# Patient Record
Sex: Female | Born: 1935 | Race: White | Hispanic: No | Smoking: Never smoker
Health system: Southern US, Community
[De-identification: ages and names within clinical notes are randomized; demographics above are authoritative.]

## PROBLEM LIST (undated history)

## (undated) DIAGNOSIS — K219 Gastro-esophageal reflux disease without esophagitis: Secondary | ICD-10-CM

## (undated) DIAGNOSIS — E785 Hyperlipidemia, unspecified: Secondary | ICD-10-CM

## (undated) DIAGNOSIS — I10 Essential (primary) hypertension: Secondary | ICD-10-CM

## (undated) DIAGNOSIS — N3281 Overactive bladder: Secondary | ICD-10-CM

## (undated) DIAGNOSIS — M1990 Unspecified osteoarthritis, unspecified site: Secondary | ICD-10-CM

## (undated) DIAGNOSIS — N1831 Chronic kidney disease, stage 3a: Secondary | ICD-10-CM

## (undated) DIAGNOSIS — J309 Allergic rhinitis, unspecified: Secondary | ICD-10-CM

## (undated) DIAGNOSIS — M810 Age-related osteoporosis without current pathological fracture: Secondary | ICD-10-CM

## (undated) HISTORY — DX: Hyperlipidemia, unspecified: E78.5

## (undated) HISTORY — DX: Age-related osteoporosis without current pathological fracture: M81.0

## (undated) HISTORY — PX: TUBAL LIGATION: SHX77

## (undated) HISTORY — PX: TONSILLECTOMY: SUR1361

## (undated) HISTORY — PX: APPENDECTOMY: SHX54

## (undated) HISTORY — DX: Unspecified osteoarthritis, unspecified site: M19.90

## (undated) HISTORY — DX: Essential (primary) hypertension: I10

## (undated) HISTORY — DX: Allergic rhinitis, unspecified: J30.9

## (undated) HISTORY — PX: CARPAL TUNNEL RELEASE: SHX101

## (undated) HISTORY — DX: Gastro-esophageal reflux disease without esophagitis: K21.9

## (undated) HISTORY — DX: Overactive bladder: N32.81

---

## 2004-12-25 ENCOUNTER — Ambulatory Visit: Payer: Self-pay | Admitting: Gastroenterology

## 2007-02-10 ENCOUNTER — Ambulatory Visit: Payer: Self-pay | Admitting: Orthopaedic Surgery

## 2010-04-14 ENCOUNTER — Ambulatory Visit: Payer: Self-pay | Admitting: Family Medicine

## 2011-05-25 ENCOUNTER — Ambulatory Visit: Payer: Self-pay | Admitting: Family Medicine

## 2012-05-31 ENCOUNTER — Ambulatory Visit: Payer: Self-pay | Admitting: Family Medicine

## 2013-06-06 ENCOUNTER — Ambulatory Visit: Payer: Self-pay | Admitting: Family Medicine

## 2014-06-12 ENCOUNTER — Ambulatory Visit: Payer: Self-pay | Admitting: Family Medicine

## 2014-08-21 DIAGNOSIS — N3281 Overactive bladder: Secondary | ICD-10-CM | POA: Insufficient documentation

## 2014-08-21 HISTORY — DX: Overactive bladder: N32.81

## 2015-02-11 ENCOUNTER — Ambulatory Visit (INDEPENDENT_AMBULATORY_CARE_PROVIDER_SITE_OTHER): Payer: Medicare Other | Admitting: Urology

## 2015-02-11 ENCOUNTER — Encounter: Payer: Self-pay | Admitting: Urology

## 2015-02-11 VITALS — BP 96/61 | HR 78 | Ht 63.0 in | Wt 125.0 lb

## 2015-02-11 DIAGNOSIS — N362 Urethral caruncle: Secondary | ICD-10-CM | POA: Insufficient documentation

## 2015-02-11 DIAGNOSIS — N3281 Overactive bladder: Secondary | ICD-10-CM | POA: Insufficient documentation

## 2015-02-11 DIAGNOSIS — J309 Allergic rhinitis, unspecified: Secondary | ICD-10-CM

## 2015-02-11 DIAGNOSIS — R3915 Urgency of urination: Secondary | ICD-10-CM

## 2015-02-11 DIAGNOSIS — M1990 Unspecified osteoarthritis, unspecified site: Secondary | ICD-10-CM

## 2015-02-11 DIAGNOSIS — M810 Age-related osteoporosis without current pathological fracture: Secondary | ICD-10-CM

## 2015-02-11 DIAGNOSIS — K219 Gastro-esophageal reflux disease without esophagitis: Secondary | ICD-10-CM

## 2015-02-11 DIAGNOSIS — I10 Essential (primary) hypertension: Secondary | ICD-10-CM

## 2015-02-11 DIAGNOSIS — E785 Hyperlipidemia, unspecified: Secondary | ICD-10-CM

## 2015-02-11 HISTORY — DX: Essential (primary) hypertension: I10

## 2015-02-11 HISTORY — DX: Unspecified osteoarthritis, unspecified site: M19.90

## 2015-02-11 HISTORY — DX: Age-related osteoporosis without current pathological fracture: M81.0

## 2015-02-11 HISTORY — DX: Hyperlipidemia, unspecified: E78.5

## 2015-02-11 HISTORY — DX: Gastro-esophageal reflux disease without esophagitis: K21.9

## 2015-02-11 HISTORY — DX: Allergic rhinitis, unspecified: J30.9

## 2015-02-11 LAB — URINALYSIS, COMPLETE
BILIRUBIN UA: NEGATIVE
Glucose, UA: NEGATIVE
KETONES UA: NEGATIVE
Nitrite, UA: NEGATIVE
Urobilinogen, Ur: 0.2 mg/dL (ref 0.2–1.0)
pH, UA: 5.5 (ref 5.0–7.5)

## 2015-02-11 LAB — MICROSCOPIC EXAMINATION: Bacteria, UA: NONE SEEN

## 2015-02-11 LAB — BLADDER SCAN AMB NON-IMAGING: Scan Result: 50

## 2015-02-11 MED ORDER — ESTROGENS, CONJUGATED 0.625 MG/GM VA CREA: 1.0000 | TOPICAL_CREAM | Freq: Every day | VAGINAL | Status: DC

## 2015-02-11 NOTE — Progress Notes (Signed)
02/11/2015 11:25 AM   Junius Argyle Feb 23, 1932 [id number]  Referring provider: Sula Rumple, MD [address] MEDICAL [address] [place], [place] [address]  Chief Complaint  Patient presents with  . Over Active Bladder    HPI: Ms Yankey is a 82yo seen in consultation for OAB. She is referred by Dr. Elmer Ramp. She has been on detrol LA and one other anticholinergic but she does recall the name of it. THe urgency and urge incontinence started over 6 months ago. She has nocturia 1x. She wears 3-4 panty liners which are usually soaked.  She wears pads at night which are often soaked when she wakes. She gets occasional bladder infections. She has not tried estrogen cream. No hx of breast cancer.  She has rare SUI. NO hx of spine surgery or diabetes. No issues with edema. PVR today is 50cc.  NO issues with constipation. UA today showed WBCs.    PMH: Past Medical History  Diagnosis Date  . BP (high blood pressure) 02/11/2015  . Allergic rhinitis 02/11/2015  . Acid reflux 02/11/2015  . Arthritis 02/11/2015  . 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 HLD (hyperlipidemia) 02/11/2015    Surgical History: Past Surgical History  Procedure Laterality Date  . Appendectomy    . Tonsillectomy    . Tubal ligation    . Carpal tunnel release      Home Medications:    Medication List       This list is accurate as of: 02/11/15 11:25 AM.  Always use your most recent med list.               alendronate 70 MG tablet  Commonly known as:  FOSAMAX     amLODipine 5 MG tablet  Commonly known as:  NORVASC  Take by mouth.     aspirin EC 81 MG tablet  Take by mouth.     calcium carbonate 1250 (500 CA) MG tablet  Commonly known as:  OS-CAL - dosed in mg of elemental calcium  Take by mouth.     CENTRUM SILVER ADULT 50+ PO  Take by mouth.     Glucosamine-Chondroitin 750-600 MG Chew  Chew by mouth.     lisinopril-hydrochlorothiazide 10-12.5 MG tablet    Commonly known as:  PRINZIDE,ZESTORETIC  Take by mouth.     lovastatin 20 MG tablet  Commonly known as:  MEVACOR     omeprazole 10 MG capsule  Commonly known as:  PRILOSEC  Take by mouth.     STOOL SOFTENER LAXATIVE DC 240 MG capsule  Generic drug:  docusate calcium  Take by mouth.     tolterodine 2 MG 24 hr capsule  Commonly known as:  DETROL LA  Take by mouth.     vitamin E 400 UNIT capsule  Take by mouth.        Allergies:  Allergies  Allergen Reactions  . Celecoxib Other (See Comments)    Shakiness  . Metronidazole Nausea Only  . Typhoid Vaccines Swelling  . Penicillins Rash    Family History: Family History  Problem Relation Age of Onset  . Hematuria Neg Hx   . Prostate cancer Neg Hx   . Kidney cancer Neg Hx   . Bladder Cancer Neg Hx     Social History:  reports that she has never smoked. She does not have any smokeless tobacco history on file. She reports that she does not drink alcohol or use illicit drugs.  ROS: UROLOGY Frequent  Urination?: Yes Hard to postpone urination?: Yes Burning/pain with urination?: Yes Get up at night to urinate?: Yes Leakage of urine?: Yes Urine stream starts and stops?: Yes Trouble starting stream?: Yes Do you have to strain to urinate?: No Blood in urine?: No Urinary tract infection?: No Sexually transmitted disease?: No Injury to kidneys or bladder?: No Painful intercourse?: No Weak stream?: No Currently pregnant?: No Vaginal bleeding?: No Last menstrual period?: No  Gastrointestinal Nausea?: No Vomiting?: No Indigestion/heartburn?: Yes Diarrhea?: No Constipation?: No  Constitutional Fever: No Night sweats?: No Weight loss?: No Fatigue?: Yes  Skin Skin rash/lesions?: No Itching?: No  Eyes Blurred vision?: No Double vision?: No  Ears/Nose/Throat Sore throat?: No Sinus problems?: Yes  Hematologic/Lymphatic Swollen glands?: No Easy bruising?: Yes  Cardiovascular Leg swelling?: No Chest  pain?: No  Respiratory Cough?: No Shortness of breath?: No  Endocrine Excessive thirst?: No  Musculoskeletal Back pain?: Yes Joint pain?: Yes  Neurological Headaches?: Yes Dizziness?: No  Psychologic Depression?: No Anxiety?: No  Physical Exam: BP 96/61 mmHg  Pulse 78  Ht  (1.6 m)  Wt 56.7 kg (125 lb)  BMI 22.15 kg/m2  Constitutional:  Alert and oriented, No acute distress. HEENT: [place] AT, moist mucus membranes.  Trachea midline, no masses. Cardiovascular: No clubbing, cyanosis, or edema. Respiratory: Normal respiratory effort, no increased work of breathing. GI: Abdomen is soft, nontender, nondistended, no abdominal masses GU: No CVA tenderness. Moderate labial edema. Severe introital stenosis. No masses/lesiosn in vagina. Urethral caruncle at 5 oclock. Grade 1 cystocele. Skin: No rashes, bruises or suspicious lesions. Lymph: No cervical or inguinal adenopathy. Neurologic: Grossly intact, no focal deficits, moving all 4 extremities. Psychiatric: Normal mood and affect.  Laboratory Data: No results found for: WBC, HGB, HCT, MCV, PLT  No results found for: CREATININE  No results found for: PSA  No results found for: TESTOSTERONE  No results found for: HGBA1C  Urinalysis No results found for: COLORURINE, APPEARANCEUR, LABSPEC, PHURINE, GLUCOSEU, HGBUR, BILIRUBINUR, KETONESUR, PROTEINUR, UROBILINOGEN, NITRITE, LEUKOCYTESUR  Pertinent Imaging: none  Assessment & Plan:    1. OAB (overactive bladder) -mirabegron  daily - Urinalysis, Complete - Bladder Scan (Post Void Residual) in office  2. Urethral caruncle -premarin daily for 14 days then 3x per week for 3 months   No Follow-up on file.  Malen Gauze, MD  [facility] [address] [place], [place] [address] [phone]

## 2015-02-11 NOTE — Progress Notes (Signed)
Bladder Scan Patient void: 50 ml Performed By: Theotis Burrow

## 2015-03-14 ENCOUNTER — Ambulatory Visit (INDEPENDENT_AMBULATORY_CARE_PROVIDER_SITE_OTHER): Payer: Medicare Other | Admitting: Urology

## 2015-03-14 ENCOUNTER — Encounter: Payer: Self-pay | Admitting: Urology

## 2015-03-14 VITALS — BP 102/62 | HR 83 | Ht 63.0 in | Wt 126.0 lb

## 2015-03-14 DIAGNOSIS — N3281 Overactive bladder: Secondary | ICD-10-CM | POA: Diagnosis not present

## 2015-03-14 LAB — BLADDER SCAN AMB NON-IMAGING: SCAN RESULT: 25

## 2015-03-14 MED ORDER — MIRABEGRON ER 50 MG PO TB24: 50.0000 mg | ORAL_TABLET | Freq: Every day | ORAL | Status: DC

## 2015-03-14 NOTE — Progress Notes (Signed)
Bladder Scan Patient  void: 25 ml Performed By: Theotis BurrowK.russell,CMA

## 2015-03-14 NOTE — Progress Notes (Signed)
03/14/2015 9:27 AM   April ArgyleBeverly Joyce Weaver Aug 11, 1931 [id number]  Referring provider: Sula Rumpleharanjit Virk, MD [address] [place], [place] [id number]  Chief Complaint  Patient presents with  . Over Active Bladder    x 1 mth f/u, burning in legs that she contributes to the premarin. also right hip pain that she is unsure if its related to arthritis or medication.     HPI: Ms April Weaver is a 82yo seen in consultation for OAB. She is referred by Dr. Elmer RampVirk. She has been on detrol LA and one other anticholinergic but she does recall the name of it. THe urgency and urge incontinence started over 6 months ago. She has nocturia 1x. She wears 3-4 panty liners which are usually soaked. She wears pads at night which are often soaked when she wakes. She gets occasional bladder infections. She has not tried estrogen cream. No hx of breast cancer.  She has rare SUI. NO hx of spine surgery or diabetes. No issues with edema.  Interval history: The patient returns today after being ejected for approximate 1 month. She rates her urinary symptoms is better. She says her knowing, but overall she is happy with the improvement. She states that they've improve the point she wouldn't consider any further progress of treatment. She is down to 2 pads per day. Her voiding diary shows mostly urgency rated at between 2 and 3.   PMH: Past Medical History  Diagnosis Date  . BP (high blood pressure) 02/11/2015  . Allergic rhinitis 02/11/2015  . Acid reflux 02/11/2015  . Arthritis 02/11/2015  . 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 HLD (hyperlipidemia) 02/11/2015    Surgical History: Past Surgical History  Procedure Laterality Date  . Appendectomy    . Tonsillectomy    . Tubal ligation    . Carpal tunnel release      Home Medications:    Medication List       This list is accurate as of: 03/14/15  9:27 AM.  Always use your most recent med list.               alendronate 70 MG tablet  Commonly known as:  FOSAMAX     alendronate 70 MG tablet  Commonly known as:  FOSAMAX  Take by mouth.     amLODipine 5 MG tablet  Commonly known as:  NORVASC  Take by mouth.     aspirin EC 81 MG tablet  Take by mouth.     calcium carbonate 1250 (500 CA) MG tablet  Commonly known as:  OS-CAL - dosed in mg of elemental calcium  Take by mouth.     CENTRUM SILVER ADULT 50+ PO  Take by mouth.     conjugated estrogens vaginal cream  Commonly known as:  PREMARIN  Place 1 Applicatorful vaginally daily. Daily for 14 days then 3x per week for 3 months     Glucosamine-Chondroitin 750-600 MG Chew  Chew by mouth.     lisinopril-hydrochlorothiazide 10-12.5 MG tablet  Commonly known as:  PRINZIDE,ZESTORETIC  Take by mouth.     lovastatin 20 MG tablet  Commonly known as:  MEVACOR     MYRBETRIQ 50 MG Tb24 tablet  Generic drug:  mirabegron ER  Take 50 mg by mouth daily.     mirabegron ER 50 MG Tb24 tablet  Commonly known as:  MYRBETRIQ  Take 1 tablet (50 mg total) by mouth daily.     omeprazole 10 MG  capsule  Commonly known as:  PRILOSEC  Take by mouth.     STOOL SOFTENER LAXATIVE DC 240 MG capsule  Generic drug:  docusate calcium  Take by mouth.     tolterodine 2 MG 24 hr capsule  Commonly known as:  DETROL LA  Take by mouth.     vitamin E 400 UNIT capsule  Take by mouth.        Allergies:  Allergies  Allergen Reactions  . Celecoxib Other (See Comments)    Shakiness  . Metronidazole Nausea Only  . Typhoid Vaccines Swelling  . Penicillins Rash    Family History: Family History  Problem Relation Age of Onset  . Hematuria Neg Hx   . Prostate cancer Neg Hx   . Kidney cancer Neg Hx   . Bladder Cancer Neg Hx     Social History:  reports that she has never smoked. She does not have any smokeless tobacco history on file. She reports that she does not drink alcohol or use illicit drugs.  ROS: UROLOGY Frequent Urination?: Yes Hard to  postpone urination?: Yes Burning/pain with urination?: No Get up at night to urinate?: Yes Leakage of urine?: Yes Urine stream starts and stops?: No Trouble starting stream?: No Do you have to strain to urinate?: No Blood in urine?: No Urinary tract infection?: No Sexually transmitted disease?: No Injury to kidneys or bladder?: No Painful intercourse?: No Weak stream?: No Currently pregnant?: No Vaginal bleeding?: No Last menstrual period?: No  Gastrointestinal Nausea?: No Vomiting?: No Indigestion/heartburn?: No Diarrhea?: No Constipation?: Yes  Constitutional Fever: No Night sweats?: No Weight loss?: No Fatigue?: No  Skin Skin rash/lesions?: No Itching?: No  Eyes Blurred vision?: No Double vision?: No  Ears/Nose/Throat Sore throat?: No Sinus problems?: No  Hematologic/Lymphatic Swollen glands?: No Easy bruising?: No  Cardiovascular Leg swelling?: No Chest pain?: No  Respiratory Cough?: No Shortness of breath?: No  Endocrine Excessive thirst?: No  Musculoskeletal Back pain?: No  Neurological Headaches?: No Dizziness?: No  Psychologic Depression?: No Anxiety?: No  Physical Exam: BP 102/62 mmHg  Pulse 83  Ht  (1.6 m)  Wt 126 lb (57.153 kg)  BMI 22.33 kg/m2  Constitutional:  Alert and oriented, No acute distress. HEENT: [place] AT, moist mucus membranes.  Trachea midline, no masses. Cardiovascular: No clubbing, cyanosis, or edema. Respiratory: Normal respiratory effort, no increased work of breathing. GI: Abdomen is soft, nontender, nondistended, no abdominal masses GU: No CVA tenderness.  Skin: No rashes, bruises or suspicious lesions. Lymph: No cervical or inguinal adenopathy. Neurologic: Grossly intact, no focal deficits, moving all 4 extremities. Psychiatric: Normal mood and affect.  Laboratory Data: No results found for: WBC, HGB, HCT, MCV, PLT  No results found for: CREATININE  No results found for: PSA  No results found  for: TESTOSTERONE  No results found for: HGBA1C  Urinalysis    Component Value Date/Time   GLUCOSEU Negative 02/11/2015 1108   BILIRUBINUR Negative 02/11/2015 1108   NITRITE Negative 02/11/2015 1108   LEUKOCYTESUR Trace* 02/11/2015 1108     Assessment & Plan:    1. OAB (overactive bladder) The patient is improved on the Myrbetriq 50 mg a day. We will refill her prescription for this and have her follow-up in 3 months to assess her symptoms.  Return in about 3 months (around 06/14/2015).  Hildred Laser, MD  [facility] [address] [place], [place] [address] [phone]

## 2015-03-18 ENCOUNTER — Telehealth: Payer: Self-pay | Admitting: Urology

## 2015-03-18 NOTE — Telephone Encounter (Signed)
Pt called stating that pharmacy told her the cost of Myrbetriq would be $135/month.  She cannot afford this & wants to know her alternative options.  Please call [phone].

## 2015-03-18 NOTE — Telephone Encounter (Signed)
If she feels the Myrbetriq is working for her, we can give her samples to help offset the cost. She is on the 50 mg daily.

## 2015-05-27 ENCOUNTER — Telehealth: Payer: Self-pay | Admitting: Urology

## 2015-05-27 NOTE — Telephone Encounter (Signed)
Pt called today to cancel her 2/7 appt.  She stated that she was not interested in taking the Myrbetriq and that it was too expensive and she could not afford it.  Pt also stated that she is doing fine right now and does not want to continue with her appts.  Pt did not want to RS her appt.

## 2015-06-18 ENCOUNTER — Ambulatory Visit: Payer: Medicare Other

## 2015-07-18 ENCOUNTER — Other Ambulatory Visit: Payer: Self-pay | Admitting: Family Medicine

## 2015-07-18 DIAGNOSIS — Z1231 Encounter for screening mammogram for malignant neoplasm of breast: Secondary | ICD-10-CM

## 2015-07-22 ENCOUNTER — Ambulatory Visit
Admission: RE | Admit: 2015-07-22 | Discharge: 2015-07-22 | Disposition: A | Discharge status: Home / Self Care | Payer: Medicare Other | Source: Ambulatory Visit | Attending: Family Medicine | Admitting: Family Medicine

## 2015-07-22 DIAGNOSIS — Z1231 Encounter for screening mammogram for malignant neoplasm of breast: Secondary | ICD-10-CM | POA: Diagnosis present

## 2016-07-06 ENCOUNTER — Other Ambulatory Visit: Payer: Self-pay | Admitting: Family Medicine

## 2016-07-06 DIAGNOSIS — Z1231 Encounter for screening mammogram for malignant neoplasm of breast: Secondary | ICD-10-CM

## 2016-07-22 ENCOUNTER — Ambulatory Visit
Admission: RE | Admit: 2016-07-22 | Discharge: 2016-07-22 | Disposition: A | Discharge status: Home / Self Care | Payer: Medicare Other | Source: Ambulatory Visit | Attending: Family Medicine | Admitting: Family Medicine

## 2016-07-22 DIAGNOSIS — Z1231 Encounter for screening mammogram for malignant neoplasm of breast: Secondary | ICD-10-CM | POA: Insufficient documentation

## 2017-05-19 ENCOUNTER — Ambulatory Visit: Admit: 2017-05-19 | Payer: PRIVATE HEALTH INSURANCE | Admitting: Internal Medicine

## 2017-05-19 SURGERY — COLONOSCOPY WITH PROPOFOL
Anesthesia: General

## 2017-07-02 ENCOUNTER — Other Ambulatory Visit: Payer: Self-pay | Admitting: Family Medicine

## 2017-07-02 DIAGNOSIS — Z1231 Encounter for screening mammogram for malignant neoplasm of breast: Secondary | ICD-10-CM

## 2017-07-26 ENCOUNTER — Ambulatory Visit
Admission: RE | Admit: 2017-07-26 | Discharge: 2017-07-26 | Disposition: A | Discharge status: Home / Self Care | Payer: Medicare Other | Source: Ambulatory Visit | Attending: Family Medicine | Admitting: Family Medicine

## 2017-07-26 DIAGNOSIS — Z1231 Encounter for screening mammogram for malignant neoplasm of breast: Secondary | ICD-10-CM | POA: Diagnosis present

## 2018-04-26 DIAGNOSIS — N183 Chronic kidney disease, stage 3 unspecified: Secondary | ICD-10-CM | POA: Diagnosis present

## 2018-10-27 ENCOUNTER — Other Ambulatory Visit: Payer: Self-pay | Admitting: Gerontology

## 2018-10-27 DIAGNOSIS — Z1231 Encounter for screening mammogram for malignant neoplasm of breast: Secondary | ICD-10-CM

## 2018-11-29 ENCOUNTER — Ambulatory Visit
Admission: RE | Admit: 2018-11-29 | Discharge: 2018-11-29 | Disposition: A | Discharge status: Home / Self Care | Payer: Medicare Other | Source: Ambulatory Visit | Attending: Gerontology | Admitting: Gerontology

## 2018-11-29 ENCOUNTER — Other Ambulatory Visit: Payer: Self-pay

## 2018-11-29 DIAGNOSIS — Z1231 Encounter for screening mammogram for malignant neoplasm of breast: Secondary | ICD-10-CM | POA: Diagnosis present

## 2019-04-25 DIAGNOSIS — F419 Anxiety disorder, unspecified: Secondary | ICD-10-CM | POA: Diagnosis present

## 2019-11-07 ENCOUNTER — Other Ambulatory Visit: Payer: Self-pay | Admitting: Gerontology

## 2019-11-07 DIAGNOSIS — Z1231 Encounter for screening mammogram for malignant neoplasm of breast: Secondary | ICD-10-CM

## 2019-11-30 ENCOUNTER — Ambulatory Visit
Admission: RE | Admit: 2019-11-30 | Discharge: 2019-11-30 | Disposition: A | Discharge status: Home / Self Care | Payer: Medicare Other | Source: Ambulatory Visit | Attending: Gerontology | Admitting: Gerontology

## 2019-11-30 ENCOUNTER — Other Ambulatory Visit: Payer: Self-pay

## 2019-11-30 DIAGNOSIS — Z1231 Encounter for screening mammogram for malignant neoplasm of breast: Secondary | ICD-10-CM

## 2020-04-24 DIAGNOSIS — R1031 Right lower quadrant pain: Secondary | ICD-10-CM | POA: Diagnosis present

## 2020-04-24 DIAGNOSIS — G8929 Other chronic pain: Secondary | ICD-10-CM | POA: Diagnosis present

## 2020-05-17 ENCOUNTER — Other Ambulatory Visit: Payer: Self-pay | Admitting: Physical Medicine & Rehabilitation

## 2020-05-17 DIAGNOSIS — G8929 Other chronic pain: Secondary | ICD-10-CM

## 2020-05-17 DIAGNOSIS — M5442 Lumbago with sciatica, left side: Secondary | ICD-10-CM

## 2020-05-27 ENCOUNTER — Ambulatory Visit: Payer: Medicare Other

## 2020-05-29 ENCOUNTER — Other Ambulatory Visit: Payer: Self-pay

## 2020-05-29 ENCOUNTER — Ambulatory Visit
Admission: RE | Admit: 2020-05-29 | Discharge: 2020-05-29 | Disposition: A | Discharge status: Home / Self Care | Payer: Medicare Other | Source: Ambulatory Visit | Attending: Physical Medicine & Rehabilitation | Admitting: Physical Medicine & Rehabilitation

## 2020-05-29 DIAGNOSIS — M5441 Lumbago with sciatica, right side: Secondary | ICD-10-CM | POA: Diagnosis present

## 2020-05-29 DIAGNOSIS — G8929 Other chronic pain: Secondary | ICD-10-CM | POA: Diagnosis present

## 2020-05-29 DIAGNOSIS — M5442 Lumbago with sciatica, left side: Secondary | ICD-10-CM | POA: Insufficient documentation

## 2020-09-02 NOTE — Patient Instructions (Signed)
SUBJECTIVE Chief complaint:   History: Patient is a pleasant 87 y.o. female seen in follow-up for the evaluation of back and bilateral leg pain left worse than right. She is status post left S1 TFESI 07/15/2020 with 50% improvement. She denies any problems or complications postinjection. She rates her pain as a 6/10. Initially she had nearly 100% relief but then pain did start coming back over the weekend. Pain is intermittent, aching, tight, burning. She does complain of numbness and tingling in the left leg but denies any weakness or loss of control of bowel or bladder. Pain is chronic. Pain is worse with standing, walking, bending and better with bending, rest and heat.    Referring Dx: Referring Provider: Pain location:  Pain: Present /10, Best /10, Worst /10: Pain quality: {pain quality:18634:::1} Radiating pain: {yes/no:20286}  Numbness/Tingling: {yes/no:20286} 24 hour pain behavior:  Aggravating factors: Easing factors: How long can you sit: How long can you stand: How long can you walk: History of back injury, pain, surgery, or therapy: {yes/no:20286} Follow-up appointment with MD: {yes/no:20286} Dominant hand: {RIGHT/LEFT:20294} Imaging: {yes/no:20286}  Falls in the last 6 months: {yes/no:20286}  Occupational demands: Hobbies: Goals: Red flags (bowel/bladder changes, saddle paresthesia, personal history of cancer, chills/fever, night sweats, unrelenting pain, first onset of insidious LBP <20 y/o) Negative    OBJECTIVE  Mental Status Patient is oriented to person, place and time.  Recent memory is intact.  Remote memory is intact.  Attention span and concentration are intact.  Expressive speech is intact.  Patient's fund of knowledge is within normal limits for educational level.  SENSATION: Grossly intact to light touch bilateral LEs as determined by testing dermatomes L2-S2 Proprioception and hot/cold testing deferred on this date   MUSCULOSKELETAL: Tremor:  None Bulk: Normal Tone: Normal No visible step-off along spinal column  Posture Lumbar lordosis: WNL Iliac crest height: equal bilaterally Lumbar lateral shift: negative Lower crossed syndrome (tight hip flexors and erector spinae; weak gluts and abs): negative  Gait **Wide based gait = spinal stenosis (+LR 12)   Palpation   Strength (out of 5) R/L 5/5 Hip flexion 5/5 Hip ER 5/5 Hip IR 5/5 Hip abduction 5/5 Hip adduction 5/5 Hip extension 5/5 Knee extension 5/5 Knee flexion 5/5 Ankle dorsiflexion 5/5 Ankle plantarflexion 5 Trunk flexion 5 Trunk extension 5/5 Trunk rotation  *Indicates pain   AROM (degrees) R/L (all movements include overpressure unless otherwise stated) Lumbar forward flexion (65):  Lumbar extension (30): Lumbar lateral flexion (25): R:  L:  Thoracic and Lumbar rotation (30 degrees):  R: degrees L: degrees Hip IR (0-45): R: L: Hip ER (0-45): R: L: Hip Flexion (0-125):  Hip Abduction (0-40): R: L: Hip extension (0-15): R: L: *Indicates pain   PROM (degrees) PROM = AROM R/L (all movements include overpressure unless otherwise stated) Lumbar forward flexion (65 degrees):  Lumbar extension (30 degrees): Lumbar lateral flexion (25 degrees): R: degrees L: degrees Thoracic and Lumbar rotation (30 degrees):  R: degrees L: degrees    Repeated Movements No centralization or peripheralization of symptoms with repeated lumbar extension or flexion.    Muscle Length Hamstrings: R: degrees L: degrees  [place]: Thomas:  Ober:    Passive Accessory Intervertebral Motion (PAIVM) Pt denies reproduction of back pain with CPA L1-L5 and UPA bilaterally L1-L5. Generally hypomobile throughout  Passive Physiological Intervertebral Motion (PPIVM) Normal flexion and extension with PPIVM testing   SPECIAL TESTS Lumbar Radiculopathy and Discogenic: Centralization and Peripheralization (SN 92, -LR 0.12): {NEGATIVE/POSITIVE VPX:10626} Slump (SN 83, -LR  0.32):  R: {NEGATIVE/POSITIVE YPP:50932} L: {NEGATIVE/POSITIVE FOR:19998} SLR (SN 92, -LR 0.29): R: {NEGATIVE/POSITIVE IZT:24580} L:  {NEGATIVE/POSITIVE DXI:33825} Crossed SLR (SP 90): R: {NEGATIVE/POSITIVE KNL:97673} L: {NEGATIVE/POSITIVE ALP:37902}  Facet Joint: Extension-Rotation (SN 100, -LR 0.0): R: {NEGATIVE/POSITIVE IOX:73532} L: {NEGATIVE/POSITIVE DJM:42683}  Lumbar Spinal Stenosis: Lumbar quadrant (SN 70): R: {NEGATIVE/POSITIVE MHD:62229} L: {NEGATIVE/POSITIVE NLG:92119}  Hip: FABER (SN 81): R: {NEGATIVE/POSITIVE FOR:19998} L: {NEGATIVE/POSITIVE ERD:40814} FADIR (SN 94): R: {NEGATIVE/POSITIVE FOR:19998} L: {NEGATIVE/POSITIVE GYJ:85631} Hip scour (SN 50): R: {NEGATIVE/POSITIVE SHF:02637} L: {NEGATIVE/POSITIVE CHY:85027}  SIJ:  Thigh Thrust (SN 88, -LR 0.18) : R: {NEGATIVE/POSITIVE XAJ:28786} L: {NEGATIVE/POSITIVE VEH:20947}  Piriformis Syndrome: FAIR Test (SN 88, SP 83): R: {NEGATIVE/POSITIVE SJG:28366} L: {NEGATIVE/POSITIVE QHU:76546}  Functional Tasks Lifting:  Squatting:  Sit to stand:  Forward Step-Down Test: R: {NEGATIVE/POSITIVE TKP:54656} L: {NEGATIVE/POSITIVE CLE:75170} Lateral Step-Down Test: R: {NEGATIVE/POSITIVE YFV:49449} L: {NEGATIVE/POSITIVE QPR:91638} Deep Squat: {NEGATIVE/POSITIVE GYK:59935}   ASSESSMENT Clinical Impression: Pt is a pleasant year-old female/female referred for low back pain. PT examination reveals deficits . Pt presents with deficits in strength, mobility, range of motion, and pain. Pt will benefit from skilled PT services to address deficits and return to pain-free function at home and work.    Pt will be independent with HEP in order to improve strength and decrease back pain in order to improve pain-free function at home and work.   Pt will decrease mODI score by at least 13 points in order demonstrate clinically significant reduction in back pain/disability.        Pt will decrease Roland-Morris Disability Questionnaire (RMDQ) by at least 5  points in order to demonstrate a clinically significant reduction in back pain/disability  Pt will decrease worst back pain as reported on NPRS by at least 2 points in order to demonstrate clinically significant reduction in back pain.   Pt will increase strength of by at least 1/2 MMT grade in order to demonstrate improvement in strength and function.   Clinical Prediction Rule for Compression Fractures: 1. >52 y/o 2. no presence of leg pain 3. BMI <22 4. client not regularly exercising 5. female <1 of 5 = SN 97, - LR 0.16 >4 of 5 = SP 96, + LR 9.6  Clinical Prediction Rule for Spinal Stenosis: 1. Bilateral symptoms 2. Leg pain > back pain 3. Pain during walking or standing 4. Pain relief when sitting 5. Age >55 y/o <1 (+) finding = rule out stenosis (SN 96) 4 of 5 (+) findings = rule in stenosis (SP 98)  Clinical Prediction Rule for Facet Joint Syndrome (check pg 460): 1. Age > 20 y/o 2. Symptoms best when walking 3. Symptoms best when sitting 4. Onset of pain is paraspinal 5. (+) lumbar extension-rotation test >3 (+) findings = SP 91, +LR 9.7 <2 (+) findings = SN 100  Pain Provocation Cluster for SIJ Dysfunction Thigh Thrust Test (SN 88, -LR 0.18) Gaenslen's Test Distraction Test Compression Test Sacral Thrust Test  3 (+) tests: if discogenic pain has been ruled out through repeated extensions; SN 94, -LR 0.80  Rule out Hip OA (SN 86) Hip pain Hip IR <15 degrees morning stiffness < 60 min > 50 y/o    Neurogenic vs Vascular Claudication Luna Kitchens, 2016)  Description Neurogenic Vascular  Quality of pain cramping Burning, cramping  LBP Frequently present Absent   Sensory symptoms Frequently present Absent  Muscle weakness Frequently present Absent  Reflex changes Frequently present Absent  Bicycle test Symptoms only when sitting upright Symptoms not position dependent  Arterial pulses  Normal  Decreased or absent  Skin/dystrophic changes Absent  Frequently  present  Aggravating factors Upright posture, extension of trunk Any leg activity  Relieving factors Sitting, bending forward Rest, no leg activity  Walking uphill Symptoms produced later Not position dependent  Walking downhill Symptoms produced earlier Not position dependent    Lumbar Spinal Stenosis vs Radiculopathy due to Disc Herniation (Rieman, 2016)  Description Spinal Stenosis Disc Herniation  Age Usually > 69 y/o Usually < 50 y/o  Onset Usually more insidious Usually more sudden  Position change: flexion Better  Worse  Position change: extension Worse Better  Focal muscle weakness Less common Can be common  Dural tension Less common Common  UMN or LMN involvement  Central stenosis: UMN Lateral foraminal stenosis: LMN LMN

## 2020-09-03 ENCOUNTER — Other Ambulatory Visit: Payer: Self-pay

## 2020-09-03 ENCOUNTER — Ambulatory Visit: Payer: Medicare Other | Attending: Physical Medicine & Rehabilitation

## 2020-09-03 DIAGNOSIS — M5442 Lumbago with sciatica, left side: Secondary | ICD-10-CM | POA: Insufficient documentation

## 2020-09-03 DIAGNOSIS — G8929 Other chronic pain: Secondary | ICD-10-CM | POA: Diagnosis present

## 2020-09-03 DIAGNOSIS — M6281 Muscle weakness (generalized): Secondary | ICD-10-CM | POA: Diagnosis present

## 2020-09-03 NOTE — Therapy (Signed)
[facility] [address] [place], [place], [address] Phone: [phone]   Fax:  [phone]  Physical Therapy Evaluation  Patient Details  Name: April Weaver MRN: [id number] Date of Birth: [birth year] Referring Provider (PT): Dr. Mariah MillingMorales   Encounter Date: 09/03/2020   PT End of Session - 09/03/20 1026    Visit Number 1    Number of Visits 17    Date for PT Re-Evaluation 10/29/20    Authorization Type eval: 09/03/20    PT Start Time 0935    PT Stop Time 1013    PT Time Calculation (min) 38 min    Activity Tolerance Patient tolerated treatment well    Behavior [facility] Therapy [facility] for tasks assessed/performed           Past Medical History:  Diagnosis Date  . Acid reflux 02/11/2015  . Allergic rhinitis 02/11/2015  . Arthritis 02/11/2015  . BP (high blood pressure) 02/11/2015  . 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 HLD (hyperlipidemia) 02/11/2015  . 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 APPENDECTOMY    . CARPAL TUNNEL RELEASE    . TONSILLECTOMY    . TUBAL LIGATION      There were no vitals filed for this visit.    Subjective Assessment - 09/03/20 0949    Subjective Left low back pain    Pertinent History Pt reports that she has been having back pain for around 10 years. Insidious onset without any known trauma. No history of prior back injuries or surgeries. She is now status post 3 rounds of left transforminal lumbar spinal injections, the last one which occurred 07/15/2020. Since injections reports approximately 50% improvement in her symptoms. Her main aggravating factors are extended standing and walking. She is able to bend forward and put her hands on her knees to relieve her symptoms or to sit down. Pain radiates down the LLE to the foot and she also has numbness in her L lateral thigh and calf as well as plantar surface of foot. Lumbar MRI on 05/29/20  showed multilevel degenerative changes of the lumbar spine with moderate spinal canal stenosis at L3-L4. Severe left neural foraminal narrowing at L4-5 and L5-S1.    Limitations Walking;Standing    How long can you sit comfortably? Unlimited    How long can you stand comfortably? 30 minutes    How long can you walk comfortably? 30 minutes    Diagnostic tests see history    Patient Stated Goals Pt would like to go to craft shows and walk    Currently in Pain? Yes    Pain Score 0-No pain   Worst: 8/10, Best: 0/10   Pain Location Back    Pain Orientation Left;Lower    Pain Descriptors / Indicators Throbbing    Pain Type Chronic pain    Pain Radiating Towards Yes, down the LLE to the L foot.    Pain Onset More than a month ago    Pain Frequency Intermittent    Aggravating Factors  extended standing, extending walking    Pain Relieving Factors leaning forward, supporting hands on knees, sitting down    Multiple Pain Sites No              [facility] PT Assessment - 09/03/20 1024      Assessment   Medical Diagnosis Lumbago with sciatica L side, other chronic pain, and spinal stenosis lumbar region without neurogenic claudication  Referring Provider (PT) Dr. Mariah Milling    Onset Date/Surgical Date 09/04/10   Approximate   Hand Dominance Right    Next MD Visit Not reported    Prior Therapy None for back      Precautions   Precautions Other (comment)   Osteoporosis     Restrictions   Weight Bearing Restrictions No      Balance Screen   Has the patient fallen in the past 6 months No    Has the patient had a decrease in activity level because of a fear of falling?  Yes    Is the patient reluctant to leave their home because of a fear of falling?  No      Home Tourist information centre manager residence      Prior Function   Level of Independence Independent    Vocation Retired    Gaffer Retired Film/video editor    Leisure Sport and exercise psychologist, genealogy, card making,  reading      Cognition   Overall Cognitive Status Within Functional Limits for tasks assessed                SUBJECTIVE  History:  Pt reports that she has been having back pain for around 10 years. Insidious onset without any known trauma. No history of prior back injuries or surgeries. She is now status post 3 rounds of left transforminal lumbar spinal injections, the last one which occurred 07/15/2020. Since injections reports approximately 50% improvement in her symptoms. Her main aggravating factors are extended standing and walking. She is able to bend forward and put her hands on her knees to relieve her symptoms or to sit down. Pain radiates down the LLE to the foot and she also has numbness in her L lateral thigh and calf as well as plantar surface of foot. Lumbar MRI on 05/29/20 showed multilevel degenerative changes of the lumbar spine with moderate spinal canal stenosis at L3-L4. Severe left neural foraminal narrowing at L4-5 and L5-S1.  Referring Dx: Lumbago with sciatica L side, other chronic pain, and spinal stenosis lumbar region without neurogenic claudication Pain location: L low back radiating into LLE Pain: Present 0/10, Best 0/10, Worst 8/10: Pain quality: throbbing Radiating pain: Yes, down the LLE to the L foot.   Numbness/Tingling: Yes, L lateral thigh, L lateral calf, and L plantar surface of foot 24 hour pain behavior: no change throughout day just dependent on activity, rarely wakes her up at night Aggravating factors: extended standing, extending walking Easing factors: leaning forward, supporting hands on knees, sitting down How long can you sit: No limitation How long can you stand: no more than 30 minutes How long can you walk: no more than 30 minutes. History of back injury, pain, surgery, or therapy: No Follow-up appointment with MD: None scheduled Dominant hand: right Imaging: Yes, see history  Falls in the last 6 months: No  Occupational demands:  Retired, office work Hobbies: Pension scheme manager, genealogy, card making, read Goals: Pt would like to go to craft shows and walk Red flags (bowel/bladder changes, saddle paresthesia, personal history of cancer, chills/fever, night sweats, unrelenting pain, first onset of insidious LBP <20 y/o) Negative    OBJECTIVE  Mental Status Patient is oriented to person, place and time.  Recent memory is intact.  Remote memory is intact.  Attention span and concentration are intact.  Expressive speech is intact.  Patient's fund of knowledge is within normal limits for educational level.  SENSATION: Grossly intact to  light touch bilateral LEs as determined by testing dermatomes L2-S2 Proprioception and hot/cold testing deferred on this date   MUSCULOSKELETAL: Tremor: None Bulk: Normal Tone: Normal No visible step-off along spinal column  Posture Decreased normal lumbar lordosis, iliac crest height equal bilaterally. With forward bend pt has a mild rib hump on the L side with possible mild scoliosis  Gait Deferred full gait assessment   Palpation Mildly tender to palpation along L lumbar paraspinals, L SIJ, and L glut max superior fibers;   Strength (out of 5) R/L 4+/4 Hip flexion 5/5* Hip ER 5/5* Hip IR 4-/4- Hip abduction 4/4- Hip adduction 3+/3+ Hip extension 5/5 Knee extension 4/4 Knee flexion 5/5 Ankle dorsiflexion Active bilateral ankle plantarflexion *Indicates pain   AROM (degrees) R/L (all movements include overpressure unless otherwise stated) Lumbar forward flexion (65): Mild loss, no pain Lumbar extension (30): Severe loss, no pain Lumbar lateral flexion (25): Moderate loss bilaterally; Thoracic and Lumbar rotation (30 degrees): Mild to moderate loss bilaterally Hip IR (0-45): mild to moderate loss bilaterally; Hip ER (0-45): mild to moderate loss bilaterally; Hip Flexion (0-125): WNL bilaterally, painful L single knee to chest in the L lumbar spine; Hip extension  (0-15): severe loss of bilateral hip extension *Indicates pain  Repeated Movements No centralization or peripheralization of symptoms with repeated lumbar extension or flexion.    Muscle Length Hamstrings: approximately 75 degrees bilaterally, no reproduction of back pain; [place]: mild impairment bilaterally;   Passive Accessory Intervertebral Motion (PAIVM) Pt reports reproduction of back pain with CPA L2-L5 and L UPA L2-L5. Generally hypomobile throughout;  SPECIAL TESTS Lumbar Radiculopathy and Discogenic: Centralization and Peripheralization (SN 92, -LR 0.12): Negative Slump (SN 83, -LR 0.32): R: Negative L: Negative SLR (SN 92, -LR 0.29): R: Negative L:  Negative Crossed SLR (SP 90): R: Negative L: Negative  Facet Joint: Extension-Rotation (SN 100, -LR 0.0): R: Negative L: Negative  Lumbar Spinal Stenosis: Lumbar quadrant (SN 70): R: Negative L: Negative  Hip: FABER (SN 81): R: Negative L: Positive FADIR (SN 94): R: Negative L: Positive Hip scour (SN 50): R: Negative L: Negative  SIJ: Thigh Thrust (SN 88, -LR 0.18) : R: Not examined L: Negative  Piriformis Syndrome: FAIR Test (SN 88, SP 83): R: Negative L: Positive          Objective measurements completed on examination: See above findings.               PT Education - 09/03/20 1026    Education Details Plan of care    Person(s) Educated Patient    Methods Explanation    Comprehension Verbalized understanding            PT Short Term Goals - 09/03/20 1042      PT SHORT TERM GOAL #1   Title Pt will be independent with HEP in order to improve strength and decrease back pain in order to improve pain-free function at home.    Time 4    Period Weeks    Status New    Target Date 10/01/20             PT Long Term Goals - 09/03/20 1042      PT LONG TERM GOAL #1   Title Pt will decrease mODI score by at least 13 points in order demonstrate clinically significant reduction in back  pain/disability.    Time 8    Period Weeks    Status New    Target Date 10/29/20  PT LONG TERM GOAL #2   Title Pt will decrease worst back pain as reported on NPRS by at least 2 points in order to demonstrate clinically significant reduction in back pain.    Baseline 09/03/20: worst: 8/10    Time 8    Period Weeks    Status New    Target Date 10/29/20      PT LONG TERM GOAL #3   Title Pt will be able to walk for at least 1 hour without an increase in her back pain in order to be able to go to craft shows with her daughter without an increase in her pain    Baseline 09/03/20: 30 minutes    Time 8    Period Weeks    Status New    Target Date 10/29/20      PT LONG TERM GOAL #4   Title Pt will increase her FOTO score to at least 62 to demonstrate significant improvement in function related to her back pain    Baseline 09/03/20: 57    Time 8    Period Weeks    Status New    Target Date 10/29/20      PT LONG TERM GOAL #5   Title Pt will report at least 50% improvement in her symptoms in order to improve pain-free function at home with meal preparation and household responsibilities    Time 8    Period Weeks    Status New    Target Date 10/29/20                  Plan - 09/03/20 1026    Clinical Impression Statement Pt is a pleasant 87 year-old female referred for low back pain. PT examination reveals decreased lumbar extension, lateral flexion, and rotation range of motion. Pt is also lacking bilateral hip internal and external rotation as well as extension. Weakness primarily in hip abduction, adduction, and extension. She is generally tender to L lumbar paraspinals and superior fibers of L glut max. Reproduction of back and left leg pain with central passive accessory and L unilateral passive accessory motion testing from L2-L5. Decreased normal lumbar lordosis in standing and with forward bend pt has a mild rib hump on the L side indicating possible mild L thoracic  scoliosis. Pt will benefit from skilled PT services to address deficits in posture, strength, and range of motion in order to return to pain-free function at home.    Personal Factors and Comorbidities Age;Past/Current Experience;Time since onset of injury/illness/exacerbation;Comorbidity 2    Comorbidities osteoporosis, OA    Examination-Activity Limitations Stand;Transfers    Examination-Participation Restrictions Community Activity;Cleaning;Meal Prep;Shop    Stability/Clinical Decision Making Evolving/Moderate complexity    Clinical Decision Making Moderate    Rehab Potential Fair    PT Frequency 2x / week    PT Duration 8 weeks    PT Treatment/Interventions ADLs/Self Care Home Management;Aquatic Therapy;Biofeedback;Canalith Repostioning;Cryotherapy;Iontophoresis 4mg /ml Dexamethasone;Moist Heat;Electrical Stimulation;Traction;Ultrasound;DME Instruction;Gait training;Stair training;Functional mobility training;Therapeutic activities;Therapeutic exercise;Balance training;Neuromuscular re-education;Patient/family education;Manual techniques;Passive range of motion;Dry needling;Vestibular;Spinal Manipulations;Joint Manipulations    PT Next Visit Plan Initiate HEP, strengthening, stretching, STM as needed for pain control    PT Home Exercise Plan None currently    Consulted and Agree with Plan of Care Patient           Patient will benefit from skilled therapeutic intervention in order to improve the following deficits and impairments:  Pain,Decreased strength  Visit Diagnosis: 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 Overactive bladder 03/14/2015  . Allergic rhinitis 02/11/2015  . Arthritis 02/11/2015  . Acid reflux 02/11/2015  . HLD (hyperlipidemia) 02/11/2015  . BP (high blood pressure) 02/11/2015  . 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 Urethral  caruncle 02/11/2015  . OAB (overactive bladder) 02/11/2015  . 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 [place], [place], [address] Phone: [phone]   Fax:  [phone]  Name: April Weaver MRN: [id number] Date of Birth: 11-16-1931

## 2020-09-06 ENCOUNTER — Other Ambulatory Visit: Payer: Self-pay

## 2020-09-06 ENCOUNTER — Ambulatory Visit: Payer: Medicare Other

## 2020-09-06 DIAGNOSIS — M6281 Muscle weakness (generalized): Secondary | ICD-10-CM

## 2020-09-06 DIAGNOSIS — M5442 Lumbago with sciatica, left side: Secondary | ICD-10-CM | POA: Diagnosis not present

## 2020-09-06 DIAGNOSIS — G8929 Other chronic pain: Secondary | ICD-10-CM

## 2020-09-06 NOTE — Therapy (Signed)
[place] [facility] [address] [place], [place], [address] Phone: [phone]   Fax:  [phone]  Physical Therapy Treatment  Patient Details  Name: April Weaver MRN: [id number] Date of Birth: Nov 05, 1931 Referring Provider (PT): Dr. Mariah Milling   Encounter Date: 09/06/2020   PT End of Session - 09/06/20 0846    Visit Number 2    Number of Visits 17    Date for PT Re-Evaluation 10/29/20    Authorization Type eval: 09/03/20    PT Start Time 0850    PT Stop Time 0935    PT Time Calculation (min) 45 min    Activity Tolerance Patient tolerated treatment well    Behavior During Therapy [facility] for tasks assessed/performed           Past Medical History:  Diagnosis Date  . Acid reflux 02/11/2015  . Allergic rhinitis 02/11/2015  . Arthritis 02/11/2015  . BP (high blood pressure) 02/11/2015  . 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 HLD (hyperlipidemia) 02/11/2015  . 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 APPENDECTOMY    . CARPAL TUNNEL RELEASE    . TONSILLECTOMY    . TUBAL LIGATION      There were no vitals filed for this visit.   Subjective Assessment - 09/06/20 0846    Subjective Left low back pain    Pertinent History Pt reports that she has been having back pain for around 10 years. Insidious onset without any known trauma. No history of prior back injuries or surgeries. She is now status post 3 rounds of left transforminal lumbar spinal injections, the last one which occurred 07/15/2020. Since injections reports approximately 50% improvement in her symptoms. Her main aggravating factors are extended standing and walking. She is able to bend forward and put her hands on her knees to relieve her symptoms or to sit down. Pain radiates down the LLE to the foot and she also has numbness in her L lateral thigh and calf as well as plantar surface of foot. Lumbar MRI on 05/29/20 showed  multilevel degenerative changes of the lumbar spine with moderate spinal canal stenosis at L3-L4. Severe left neural foraminal narrowing at L4-5 and L5-S1.    Limitations Walking;Standing    How long can you sit comfortably? Unlimited    How long can you stand comfortably? 30 minutes    How long can you walk comfortably? 30 minutes    Diagnostic tests see history    Patient Stated Goals Pt would like to go to craft shows and walk    Currently in Pain? Yes                  TREATMENT   Ther-ex  NuStep L1-2 x 4 minutes for warm-up during history (2 minutes unbilled); Hooklying posterior pelvic tilts 5s hold 2 x 10; Hooklying SLR 2 x 10 with transverse abdominis contraction; HEP education;   Manual Therapy  L hip single knee to chest stretch, discontinued due to anterior L groin pain; L hip FABER and FADIR stretch 45s x 2 each; L hamstring stretch 45s x 2; Attempted hooklying lumbar rotation stretches but patient unable to localize stretch so discontinued; Hip flexion stretches off side of bed with therapist blocking pelvis at ASIS 45s x 2 bilateral; L hip medial to lateral mobilizations, grade I-II, with belt assist 30s/bout x 2 bouts;  L hip inferior mobilizations at 90 flexion, grade I-II, with belt  assist 30s/bout x 2 bouts; L hip long axis distraction with belt assist 30s x 2;    Pt educated throughout session about proper posture and technique with exercises. Improved exercise technique, movement at target joints, use of target muscles after min to mod verbal, visual, tactile cues.    Initiated stretches, strengthening, and left hip mobilizations/long axis distraction at visit today.  She reports anterior left groin pain during left hip single knee-to-chest stretch so discontinued.  Also attempted hook lying lumbar rotation stretches but patient unable to localize the stretch sensation so discontinued.  HEP initiated for left hip/low back stretches as well as abdominal  stabilization.  Patient encouraged to follow-up scheduled.  Will progress directed and stabilization exercises at follow-up sessions.  Patient benefit from skilled PT services to address deficits and low back and left lower extremity pain in order to improve pain-free function at home.                          PT Short Term Goals - 09/03/20 1042      PT SHORT TERM GOAL #1   Title Pt will be independent with HEP in order to improve strength and decrease back pain in order to improve pain-free function at home.    Time 4    Period Weeks    Status New    Target Date 10/01/20             PT Long Term Goals - 09/03/20 1042      PT LONG TERM GOAL #1   Title Pt will decrease mODI score by at least 13 points in order demonstrate clinically significant reduction in back pain/disability.    Time 8    Period Weeks    Status New    Target Date 10/29/20      PT LONG TERM GOAL #2   Title Pt will decrease worst back pain as reported on NPRS by at least 2 points in order to demonstrate clinically significant reduction in back pain.    Baseline 09/03/20: worst: 8/10    Time 8    Period Weeks    Status New    Target Date 10/29/20      PT LONG TERM GOAL #3   Title Pt will be able to walk for at least 1 hour without an increase in her back pain in order to be able to go to craft shows with her daughter without an increase in her pain    Baseline 09/03/20: 30 minutes    Time 8    Period Weeks    Status New    Target Date 10/29/20      PT LONG TERM GOAL #4   Title Pt will increase her FOTO score to at least 62 to demonstrate significant improvement in function related to her back pain    Baseline 09/03/20: 57    Time 8    Period Weeks    Status New    Target Date 10/29/20      PT LONG TERM GOAL #5   Title Pt will report at least 50% improvement in her symptoms in order to improve pain-free function at home with meal preparation and household responsibilities    Time 8     Period Weeks    Status New    Target Date 10/29/20                 Plan - 09/06/20 2505  Personal Factors and Comorbidities Age;Past/Current Experience;Time since onset of injury/illness/exacerbation;Comorbidity 2    Comorbidities osteoporosis, OA    Examination-Activity Limitations Stand;Transfers    Examination-Participation Restrictions Community Activity;Cleaning;Meal Prep;Shop    Stability/Clinical Decision Making Evolving/Moderate complexity    Rehab Potential Fair    PT Frequency 2x / week    PT Duration 8 weeks    PT Treatment/Interventions ADLs/Self Care Home Management;Aquatic Therapy;Biofeedback;Canalith Repostioning;Cryotherapy;Iontophoresis 4mg /ml Dexamethasone;Moist Heat;Electrical Stimulation;Traction;Ultrasound;DME Instruction;Gait training;Stair training;Functional mobility training;Therapeutic activities;Therapeutic exercise;Balance training;Neuromuscular re-education;Patient/family education;Manual techniques;Passive range of motion;Dry needling;Vestibular;Spinal Manipulations;Joint Manipulations    PT Next Visit Plan Review HEP, strengthening, stretching, STM as needed for pain control    PT Home Exercise Plan Access Code: [email or web address]    Consulted and Agree with Plan of Care Patient           Patient will benefit from skilled therapeutic intervention in order to improve the following deficits and impairments:  Pain,Decreased strength  Visit Diagnosis: 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 Overactive bladder 03/14/2015  . Allergic rhinitis 02/11/2015  . Arthritis 02/11/2015  . Acid reflux 02/11/2015  . HLD (hyperlipidemia) 02/11/2015  . BP (high blood pressure) 02/11/2015  . 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 Urethral caruncle 02/11/2015  . OAB (overactive bladder) 02/11/2015  . Detrusor muscle hypertonia  08/21/2014   10/21/2014 PT, DPT, GCS  Bobbi Kozakiewicz 09/06/2020, 9:35 AM  [place] [address] [place], [place], [place] Phone: [phone]   Fax:  [phone]  Name: Alleene Stoy MRN: Junius Argyle Date of Birth: [birth year]

## 2020-09-06 NOTE — Patient Instructions (Addendum)
Access Code: [email or web address] URL: [email or web address] Date: 09/09/2020 Prepared by: Ria Comment  Exercises Supine Figure 4 Piriformis Stretch - 2 x daily - 7 x weekly - 2 reps - 45s (perform on both sides) hold Supine Piriformis Stretch with Foot on Ground - 2 x daily - 7 x weekly - 2 reps - 45s (perform on both sides) hold Supine Posterior Pelvic Tilt - 2 x daily - 7 x weekly - 2 sets - 10 reps - 3s hold Straight Leg Raise - 2 x daily - 7 x weekly - 2 sets - 10 reps - 3s hold Supine Bridge - 2 x daily - 7 x weekly - 2 sets - 10 reps - 3s hold

## 2020-09-06 NOTE — Patient Instructions (Signed)
Access Code: [email or web address] URL: [email or web address] Date: 09/06/2020 Prepared by: Ria Comment  Exercises Straight Leg Raise - 2 x daily - 7 x weekly - 2 sets - 10 reps Supine Posterior Pelvic Tilt - 2 x daily - 7 x weekly - 2 sets - 10 reps Supine Figure 4 Piriformis Stretch - 2 x daily - 7 x weekly - 2 reps - 45s (perform on both sides) hold Supine Piriformis Stretch with Foot on Ground - 2 x daily - 7 x weekly - 2 reps - 45s (perform on both sides) hold

## 2020-09-09 ENCOUNTER — Other Ambulatory Visit: Payer: Self-pay

## 2020-09-09 ENCOUNTER — Ambulatory Visit: Payer: Medicare Other | Attending: Physical Medicine & Rehabilitation

## 2020-09-09 DIAGNOSIS — G8929 Other chronic pain: Secondary | ICD-10-CM | POA: Insufficient documentation

## 2020-09-09 DIAGNOSIS — M6281 Muscle weakness (generalized): Secondary | ICD-10-CM | POA: Insufficient documentation

## 2020-09-09 DIAGNOSIS — M5442 Lumbago with sciatica, left side: Secondary | ICD-10-CM | POA: Diagnosis not present

## 2020-09-09 NOTE — Patient Instructions (Incomplete)
TREATMENT   Ther-ex NuStep L1-4 x 5 minutes for warm-up during history with therapist adjusting resistance (2 minutes unbilled); Hooklying transverse abdominis contraction alternating marches x 20 BLE; Hooklying posterior pelvic tilts 5s hold 2 x 10; Hooklying SLR 2 x 10 withtransverse abdominis contraction; Hooklying bridges 2 x 10; Hooklying clams with manual resistance x 10; Hooklying adductor squeezes with manual resistance x 10;   Manual Therapy Bilateral hip FABER and FADIR stretch 45s x 2 each; Bilateral hamstring stretch 45s x 2, added alternating L ankle DF/PF when stretching L side for sciatic nerve mobility; Hip flexion stretches off side of bed with therapist blocking pelvis at ASIS 2 x 45s bilateral; L hip medial to lateral mobilizations, grade I-II, with belt assist 30s/bout x 2 bouts;  L hip inferior mobilizations at 90 flexion, grade I-II, with belt assist 30s/bout x 2 bouts; L hip long axis distraction with belt assist 30s x 2;   Pt educated throughout session about proper posture and technique with exercises. Improved exercise technique, movement at target joints, use of target muscles after min to mod verbal, visual, tactile cues.   Continued with stretches, strengthening, and left hip mobilizations/long axis distraction at visit today. Reviewed HEP and pt is able to perform all exercises correctly. Continued with L hip/low back stretching as well as abdominal/low back strengthening. Progressed HEP to include bridges. Patient encouraged to follow-up scheduled. Will progress directed and stabilization exercises at follow-up sessions. Patient benefit from skilled PT services to address deficits and low back and left lower extremity pain in order to improve pain-free function at home.

## 2020-09-09 NOTE — Therapy (Signed)
[facility] [address]. [place], [place], [address] Phone: [phone]   Fax:  [phone]  Physical Therapy Treatment  Patient Details  Name: April Weaver MRN: [id number] Date of Birth: [birth year] Referring Provider (PT): Dr. Mariah Milling   Encounter Date: 09/09/2020   PT End of Session - 09/09/20 0926    Visit Number 3    Number of Visits 17    Date for PT Re-Evaluation 10/29/20    Authorization Type eval: 09/03/20    PT Start Time 0930    PT Stop Time 1015    PT Time Calculation (min) 45 min    Activity Tolerance Patient tolerated treatment well    Behavior During Therapy [facility] for tasks assessed/performed           Past Medical History:  Diagnosis Date  . Acid reflux 02/11/2015  . Allergic rhinitis 02/11/2015  . Arthritis 02/11/2015  . BP (high blood pressure) 02/11/2015  . 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 HLD (hyperlipidemia) 02/11/2015  . 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 APPENDECTOMY    . CARPAL TUNNEL RELEASE    . TONSILLECTOMY    . TUBAL LIGATION      There were no vitals filed for this visit.   Subjective Assessment - 09/09/20 0925    Subjective Pt reports that she is doing well today. She denies any resting back pain upon arrival. She reports some mild muscle soreness after the last session but nothing excessive. No specific questions currently.    Pertinent History Pt reports that she has been having back pain for around 10 years. Insidious onset without any known trauma. No history of prior back injuries or surgeries. She is now status post 3 rounds of left transforminal lumbar spinal injections, the last one which occurred 07/15/2020. Since injections reports approximately 50% improvement in her symptoms. Her main aggravating factors are extended standing and walking. She is able to bend forward and put her hands on her knees to relieve her  symptoms or to sit down. Pain radiates down the LLE to the foot and she also has numbness in her L lateral thigh and calf as well as plantar surface of foot. Lumbar MRI on 05/29/20 showed multilevel degenerative changes of the lumbar spine with moderate spinal canal stenosis at L3-L4. Severe left neural foraminal narrowing at L4-5 and L5-S1.    Limitations Walking;Standing    How long can you sit comfortably? Unlimited    How long can you stand comfortably? 30 minutes    How long can you walk comfortably? 30 minutes    Diagnostic tests see history    Patient Stated Goals Pt would like to go to craft shows and walk             TREATMENT   Ther-ex  NuStep L1-4 x 5 minutes for warm-up during history with therapist adjusting resistance (2 minutes unbilled); Hooklying transverse abdominis contraction alternating marches x 20 BLE; Hooklying posterior pelvic tilts 5s hold 2 x 10; Hooklying SLR 2 x 10 with transverse abdominis contraction; Hooklying bridges 2 x 10; Hooklying clams with manual resistance x 10; Hooklying adductor squeezes with manual resistance x 10;   Manual Therapy  Bilateral hip FABER and FADIR stretch 45s x 2 each; Bilateral hamstring stretch 45s x 2, added alternating L ankle DF/PF when stretching L side for sciatic nerve mobility; Hip flexion stretches off side of bed with therapist blocking  pelvis at ASIS 2 x 45s bilateral; L hip medial to lateral mobilizations, grade I-II, with belt assist 30s/bout x 2 bouts;  L hip inferior mobilizations at 90 flexion, grade I-II, with belt assist 30s/bout x 2 bouts; L hip long axis distraction with belt assist 30s x 2;    Pt educated throughout session about proper posture and technique with exercises. Improved exercise technique, movement at target joints, use of target muscles after min to mod verbal, visual, tactile cues.    Continued with stretches, strengthening, and left hip mobilizations/long axis distraction at  visit today. Reviewed HEP and pt is able to perform all exercises correctly. Continued with L hip/low back stretching as well as abdominal/low back strengthening. Progressed HEP to include bridges. Patient encouraged to follow-up scheduled. Will progress directed and stabilization exercises at follow-up sessions.  Patient benefit from skilled PT services to address deficits and low back and left lower extremity pain in order to improve pain-free function at home.                          PT Short Term Goals - 09/03/20 1042      PT SHORT TERM GOAL #1   Title Pt will be independent with HEP in order to improve strength and decrease back pain in order to improve pain-free function at home.    Time 4    Period Weeks    Status New    Target Date 10/01/20             PT Long Term Goals - 09/06/20 0954      PT LONG TERM GOAL #1   Title Pt will decrease mODI score by at least 13 points in order demonstrate clinically significant reduction in back pain/disability.    Baseline 09/03/20:20%    Time 8    Period Weeks    Status New    Target Date 10/29/20      PT LONG TERM GOAL #2   Title Pt will decrease worst back pain as reported on NPRS by at least 2 points in order to demonstrate clinically significant reduction in back pain.    Baseline 09/03/20: worst: 8/10    Time 8    Period Weeks    Status New    Target Date 10/29/20      PT LONG TERM GOAL #3   Title Pt will be able to walk for at least 1 hour without an increase in her back pain in order to be able to go to craft shows with her daughter without an increase in her pain    Baseline 09/03/20: 30 minutes    Time 8    Period Weeks    Status New    Target Date 10/29/20      PT LONG TERM GOAL #4   Title Pt will increase her FOTO score to at least 62 to demonstrate significant improvement in function related to her back pain    Baseline 09/03/20: 57    Time 8    Period Weeks    Status New    Target Date  10/29/20      PT LONG TERM GOAL #5   Title Pt will report at least 50% improvement in her symptoms in order to improve pain-free function at home with meal preparation and household responsibilities    Time 8    Period Weeks    Status New    Target Date 10/29/20  Plan - 09/09/20 0926    Clinical Impression Statement Continued with stretches, strengthening, and left hip mobilizations/long axis distraction at visit today. Reviewed HEP and pt is able to perform all exercises correctly. Continued with L hip/low back stretching as well as abdominal/low back strengthening. Progressed HEP to include bridges. Patient encouraged to follow-up scheduled. Will progress directed and stabilization exercises at follow-up sessions.  Patient benefit from skilled PT services to address deficits and low back and left lower extremity pain in order to improve pain-free function at home.    Personal Factors and Comorbidities Age;Past/Current Experience;Time since onset of injury/illness/exacerbation;Comorbidity 2    Comorbidities osteoporosis, OA    Examination-Activity Limitations Stand;Transfers    Examination-Participation Restrictions Community Activity;Cleaning;Meal Prep;Shop    Stability/Clinical Decision Making Evolving/Moderate complexity    Rehab Potential Fair    PT Frequency 2x / week    PT Duration 8 weeks    PT Treatment/Interventions ADLs/Self Care Home Management;Aquatic Therapy;Biofeedback;Canalith Repostioning;Cryotherapy;Iontophoresis 4mg /ml Dexamethasone;Moist Heat;Electrical Stimulation;Traction;Ultrasound;DME Instruction;Gait training;Stair training;Functional mobility training;Therapeutic activities;Therapeutic exercise;Balance training;Neuromuscular re-education;Patient/family education;Manual techniques;Passive range of motion;Dry needling;Vestibular;Spinal Manipulations;Joint Manipulations    PT Next Visit Plan Review HEP, strengthening, stretching, STM as needed for  pain control    PT Home Exercise Plan Access Code: [email or web address]    Consulted and Agree with Plan of Care Patient           Patient will benefit from skilled therapeutic intervention in order to improve the following deficits and impairments:  Pain,Decreased strength  Visit Diagnosis: 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 Overactive bladder 03/14/2015  . Allergic rhinitis 02/11/2015  . Arthritis 02/11/2015  . Acid reflux 02/11/2015  . HLD (hyperlipidemia) 02/11/2015  . BP (high blood pressure) 02/11/2015  . 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 Urethral caruncle 02/11/2015  . OAB (overactive bladder) 02/11/2015  . 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 [place], [place], [place] Phone: [phone]   Fax:  [phone]  Name: April Weaver MRN: Junius Argyle Date of Birth: 02-25-1932

## 2020-09-11 ENCOUNTER — Other Ambulatory Visit: Payer: Self-pay

## 2020-09-11 ENCOUNTER — Ambulatory Visit: Payer: Medicare Other

## 2020-09-11 DIAGNOSIS — M5442 Lumbago with sciatica, left side: Secondary | ICD-10-CM

## 2020-09-11 DIAGNOSIS — G8929 Other chronic pain: Secondary | ICD-10-CM

## 2020-09-11 DIAGNOSIS — M6281 Muscle weakness (generalized): Secondary | ICD-10-CM

## 2020-09-11 NOTE — Therapy (Signed)
[facility] [address]. [place], [place], [address] Phone: [phone]   Fax:  [phone]  Physical Therapy Treatment  Patient Details  Name: April Weaver MRN: [id number] Date of Birth: 1932/04/19 Referring Provider (PT): Dr. Mariah Milling   Encounter Date: 09/11/2020   PT End of Session - 09/11/20 2052    Visit Number 4    Number of Visits 17    Date for PT Re-Evaluation 10/29/20    Authorization Type eval: 09/03/20    PT Start Time 0930    PT Stop Time 1015    PT Time Calculation (min) 45 min    Activity Tolerance Patient tolerated treatment well    Behavior During Therapy [facility] for tasks assessed/performed           Past Medical History:  Diagnosis Date  . Acid reflux 02/11/2015  . Allergic rhinitis 02/11/2015  . Arthritis 02/11/2015  . BP (high blood pressure) 02/11/2015  . 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 HLD (hyperlipidemia) 02/11/2015  . 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 APPENDECTOMY    . CARPAL TUNNEL RELEASE    . TONSILLECTOMY    . TUBAL LIGATION      There were no vitals filed for this visit.   Subjective Assessment - 09/11/20 1040    Subjective Pt reports that she is doing well today. She denies any resting back pain upon arrival. Denies any muscle soreness after the last session. She did have a couple episodes of back/hip pain since last session and had to sit down in order for symptoms to resolve. No specific questions currently.    Pertinent History Pt reports that she has been having back pain for around 10 years. Insidious onset without any known trauma. No history of prior back injuries or surgeries. She is now status post 3 rounds of left transforminal lumbar spinal injections, the last one which occurred 07/15/2020. Since injections reports approximately 50% improvement in her symptoms. Her main aggravating factors are extended standing  and walking. She is able to bend forward and put her hands on her knees to relieve her symptoms or to sit down. Pain radiates down the LLE to the foot and she also has numbness in her L lateral thigh and calf as well as plantar surface of foot. Lumbar MRI on 05/29/20 showed multilevel degenerative changes of the lumbar spine with moderate spinal canal stenosis at L3-L4. Severe left neural foraminal narrowing at L4-5 and L5-S1.    Limitations Walking;Standing    How long can you sit comfortably? Unlimited    How long can you stand comfortably? 30 minutes    How long can you walk comfortably? 30 minutes    Diagnostic tests see history    Patient Stated Goals Pt would like to go to craft shows and walk    Currently in Pain? No/denies             TREATMENT   Ther-ex NuStep L1-4 x 5 minutes for warm-up during history with 45s intervals, therapist adjusting resistance; Hooklying anterior/posterior pelvic tilts 5s hold x 10 each; Hooklying transverse abdominis contraction alternating marches x 1 minute; Hooklying SLR  withtransverse abdominis contraction x 30s on each side; Hooklying bridges 2 x 10; Sidelying hip abduction x 10 BLE; Sidelying clams with manual resistance form therapist x 10 BLE; Alternating bicycles x 10 BLE;   Manual Therapy Bilateral hip FABER and FADIR stretch 45s x 2  each; Bilateral hamstring stretch 45s x 2, added alternating L ankle DF/PF when stretching L side for sciatic nerve mobility; Bilateral hip flexor stretch 45s x 2; STM to bilateral lumbar paraspinals as well as posterior and lateral hips with TheraBand roller;   Pt educated throughout session about proper posture and technique with exercises. Improved exercise technique, movement at target joints, use of target muscles after min to mod verbal, visual, tactile cues.   Continued with L hip/low back stretching as well as abdominal/low back strengthening during session today. She denies any  increase in pain during session. Reviewed HEP with patient. She is making excellent progress toward her goals. Patient encouraged to follow-up scheduled. Will progress directed and stabilization exercises at follow-up sessions. Patient benefit from skilled PT services to address deficits and low back and left lower extremity pain in order to improve pain-free function at home.                   PT Short Term Goals - 09/03/20 1042      PT SHORT TERM GOAL #1   Title Pt will be independent with HEP in order to improve strength and decrease back pain in order to improve pain-free function at home.    Time 4    Period Weeks    Status New    Target Date 10/01/20             PT Long Term Goals - 09/06/20 0954      PT LONG TERM GOAL #1   Title Pt will decrease mODI score by at least 13 points in order demonstrate clinically significant reduction in back pain/disability.    Baseline 09/03/20:20%    Time 8    Period Weeks    Status New    Target Date 10/29/20      PT LONG TERM GOAL #2   Title Pt will decrease worst back pain as reported on NPRS by at least 2 points in order to demonstrate clinically significant reduction in back pain.    Baseline 09/03/20: worst: 8/10    Time 8    Period Weeks    Status New    Target Date 10/29/20      PT LONG TERM GOAL #3   Title Pt will be able to walk for at least 1 hour without an increase in her back pain in order to be able to go to craft shows with her daughter without an increase in her pain    Baseline 09/03/20: 30 minutes    Time 8    Period Weeks    Status New    Target Date 10/29/20      PT LONG TERM GOAL #4   Title Pt will increase her FOTO score to at least 62 to demonstrate significant improvement in function related to her back pain    Baseline 09/03/20: 57    Time 8    Period Weeks    Status New    Target Date 10/29/20      PT LONG TERM GOAL #5   Title Pt will report at least 50% improvement in her symptoms in  order to improve pain-free function at home with meal preparation and household responsibilities    Time 8    Period Weeks    Status New    Target Date 10/29/20                 Plan - 09/11/20 1011    Clinical Impression Statement  Continued with L hip/low back stretching as well as abdominal/low back strengthening during session today. She denies any increase in pain during session. Reviewed HEP with patient. She is making excellent progress toward her goals. Patient encouraged to follow-up scheduled. Will progress directed and stabilization exercises at follow-up sessions.  Patient benefit from skilled PT services to address deficits and low back and left lower extremity pain in order to improve pain-free function at home.    Personal Factors and Comorbidities Age;Past/Current Experience;Time since onset of injury/illness/exacerbation;Comorbidity 2    Comorbidities osteoporosis, OA    Examination-Activity Limitations Stand;Transfers    Examination-Participation Restrictions Community Activity;Cleaning;Meal Prep;Shop    Stability/Clinical Decision Making Evolving/Moderate complexity    Rehab Potential Fair    PT Frequency 2x / week    PT Duration 8 weeks    PT Treatment/Interventions ADLs/Self Care Home Management;Aquatic Therapy;Biofeedback;Canalith Repostioning;Cryotherapy;Iontophoresis 4mg /ml Dexamethasone;Moist Heat;Electrical Stimulation;Traction;Ultrasound;DME Instruction;Gait training;Stair training;Functional mobility training;Therapeutic activities;Therapeutic exercise;Balance training;Neuromuscular re-education;Patient/family education;Manual techniques;Passive range of motion;Dry needling;Vestibular;Spinal Manipulations;Joint Manipulations    PT Next Visit Plan Review HEP, strengthening, stretching, STM as needed for pain control    PT Home Exercise Plan Access Code: [email or web address]    Consulted and Agree with Plan of Care Patient           Patient will benefit from skilled  therapeutic intervention in order to improve the following deficits and impairments:  Pain,Decreased strength  Visit Diagnosis: 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 Overactive bladder 03/14/2015  . Allergic rhinitis 02/11/2015  . Arthritis 02/11/2015  . Acid reflux 02/11/2015  . HLD (hyperlipidemia) 02/11/2015  . BP (high blood pressure) 02/11/2015  . 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 Urethral caruncle 02/11/2015  . OAB (overactive bladder) 02/11/2015  . 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 [place], [place], [place] Phone: [phone]   Fax:  [phone]  Name: Philicia Heyne MRN: Junius Argyle Date of Birth: December 05, 1931

## 2020-09-13 NOTE — Patient Instructions (Incomplete)
TREATMENT   Ther-ex NuStep L1-4x for warm-up during history with 45s intervals, therapist adjusting resistance; Hooklying anterior/posterior pelvic tilts 5s hold x 10 each; Hooklyingtransverse abdominis contractionalternating marches x 1 minute; Hooklying SLR  withtransverse abdominis contraction x 30s on each side; Hooklying bridges 2 x 10; Sidelying hip abduction x 10 BLE; Sidelying clams with manual resistance form therapist x 10 BLE; Alternating bicycles x 10 BLE;   Manual Therapy Bilateralhip FABER and FADIR stretch 45s x 2 each; Bilateralhamstring stretch 45s x 2, added alternating L ankle DF/PF when stretching L side for sciatic nerve mobility; Bilateral hip flexor stretch 45s x 2; STM to bilateral lumbar paraspinals as well as posterior and lateral hips with TheraBand roller;   Pt educated throughout session about proper posture and technique with exercises. Improved exercise technique, movement at target joints, use of target muscles after min to mod verbal, visual, tactile cues.   Continued with L hip/low back stretching as well as abdominal/low back strengthening during session today. She denies any increase in pain during session. Reviewed HEP with patient. She is making excellent progress toward her goals.Patient encouraged to follow-up scheduled. Will progress directed and stabilization exercises at follow-up sessions. Patient benefit from skilled PT services to address deficits and low back and left lower extremity pain in order to improve pain-free function at home.

## 2020-09-16 ENCOUNTER — Ambulatory Visit: Payer: Medicare Other

## 2020-09-16 DIAGNOSIS — M6281 Muscle weakness (generalized): Secondary | ICD-10-CM

## 2020-09-16 DIAGNOSIS — G8929 Other chronic pain: Secondary | ICD-10-CM

## 2020-09-17 NOTE — Patient Instructions (Incomplete)
TREATMENT   Ther-ex NuStep L1-4x for warm-up during history with 45s intervals, therapist adjusting resistance; Hooklying anterior/posterior pelvic tilts 5s hold x 10 each; Hooklyingtransverse abdominis contractionalternating marches x 1 minute; Hooklying SLR  withtransverse abdominis contraction x 30s on each side; Hooklying bridges 2 x 10; Sidelying hip abduction x 10 BLE; Sidelying clams with manual resistance form therapist x 10 BLE; Alternating bicycles x 10 BLE;   Manual Therapy Bilateralhip FABER and FADIR stretch 45s x 2 each; Bilateralhamstring stretch 45s x 2, added alternating L ankle DF/PF when stretching L side for sciatic nerve mobility; Bilateral hip flexor stretch 45s x 2; STM to bilateral lumbar paraspinals as well as posterior and lateral hips with TheraBand roller;   Pt educated throughout session about proper posture and technique with exercises. Improved exercise technique, movement at target joints, use of target muscles after min to mod verbal, visual, tactile cues.   Continued with L hip/low back stretching as well as abdominal/low back strengthening during session today. She denies any increase in pain during session. Reviewed HEP with patient. She is making excellent progress toward her goals.Patient encouraged to follow-up scheduled. Will progress directed and stabilization exercises at follow-up sessions. Patient benefit from skilled PT services to address deficits and low back and left lower extremity pain in order to improve pain-free function at home.

## 2020-09-18 ENCOUNTER — Ambulatory Visit: Payer: Medicare Other

## 2020-09-18 ENCOUNTER — Other Ambulatory Visit: Payer: Self-pay

## 2020-09-18 DIAGNOSIS — M6281 Muscle weakness (generalized): Secondary | ICD-10-CM

## 2020-09-18 DIAGNOSIS — G8929 Other chronic pain: Secondary | ICD-10-CM

## 2020-09-18 DIAGNOSIS — M5442 Lumbago with sciatica, left side: Secondary | ICD-10-CM | POA: Diagnosis not present

## 2020-09-18 NOTE — Therapy (Signed)
[facility] [address] [place], [place], [address] Phone: [phone]   Fax:  [phone]  Physical Therapy Treatment  Patient Details  Name: April Weaver MRN: [id number] Date of Birth: 11-Dec-1931 Referring Provider (PT): Dr. Mariah Milling   Encounter Date: 09/18/2020   PT End of Session - 09/18/20 1330    Visit Number 5    Number of Visits 17    Date for PT Re-Evaluation 10/29/20    Authorization Type eval: 09/03/20    Activity Tolerance Patient tolerated treatment well    Behavior During Therapy [facility] for tasks assessed/performed           Past Medical History:  Diagnosis Date  . Acid reflux 02/11/2015  . Allergic rhinitis 02/11/2015  . Arthritis 02/11/2015  . BP (high blood pressure) 02/11/2015  . 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 HLD (hyperlipidemia) 02/11/2015  . 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 APPENDECTOMY    . CARPAL TUNNEL RELEASE    . TONSILLECTOMY    . TUBAL LIGATION      There were no vitals filed for this visit.   Subjective Assessment - 09/18/20 1329    Subjective Pt reports that she is doing well today. She denies any resting back pain upon arrival. Denies any muscle soreness after the last session. She did have some low back pain while camping over the weekend. No specific questions currently.    Pertinent History Pt reports that she has been having back pain for around 10 years. Insidious onset without any known trauma. No history of prior back injuries or surgeries. She is now status post 3 rounds of left transforminal lumbar spinal injections, the last one which occurred 07/15/2020. Since injections reports approximately 50% improvement in her symptoms. Her main aggravating factors are extended standing and walking. She is able to bend forward and put her hands on her knees to relieve her symptoms or to sit down. Pain radiates down the LLE  to the foot and she also has numbness in her L lateral thigh and calf as well as plantar surface of foot. Lumbar MRI on 05/29/20 showed multilevel degenerative changes of the lumbar spine with moderate spinal canal stenosis at L3-L4. Severe left neural foraminal narrowing at L4-5 and L5-S1.    Limitations Walking;Standing    How long can you sit comfortably? Unlimited    How long can you stand comfortably? 30 minutes    How long can you walk comfortably? 30 minutes    Diagnostic tests see history    Patient Stated Goals Pt would like to go to craft shows and walk    Currently in Pain? No/denies             TREATMENT   Ther-ex NuStep L1-2x for warm-up during history with 45s intervals, therapist adjusting resistance; Hooklying anterior/posterior pelvic tilts 5s hold x 10 each; Hooklyingtransverse abdominis contractionalternating marches x 1 minute; Hooklying SLR  withtransverse abdominis contraction x 30s on each side; Hooklying bridges 2 x 10; Sit to stand with 5# dumbbell 2 x 10; Nautilus resisted forward, backward, R lateral, and L lateral 50# x 3 each direction;   Manual Therapy Bilateralhip SKTC, FABER and FADIR stretch x 45s x 2 each; Bilateralhamstring stretch 45s x 2, added alternating L ankle DF/PF when stretching L side for sciatic nerve mobility; STM to bilateral lumbar paraspinals as well as posterior and lateral hips with TheraBand roller;  Pt educated throughout session about proper posture and technique with exercises. Improved exercise technique, movement at target joints, use of target muscles after min to mod verbal, visual, tactile cues.   Continued with L hip/low back stretching as well as abdominal/low back strengthening during session today. She denies any increase in pain during session. Introduced resisted gait on [employer] and pt struggles with lateral stepping. She is making excellent progress toward her goals.Patient  encouraged to follow-up scheduled. Will progress directed and stabilization exercises at follow-up sessions. Patient benefit from skilled PT services to address deficits and low back and left lower extremity pain in order to improve pain-free function at home.                             PT Short Term Goals - 09/03/20 1042      PT SHORT TERM GOAL #1   Title Pt will be independent with HEP in order to improve strength and decrease back pain in order to improve pain-free function at home.    Time 4    Period Weeks    Status New    Target Date 10/01/20             PT Long Term Goals - 09/06/20 0954      PT LONG TERM GOAL #1   Title Pt will decrease mODI score by at least 13 points in order demonstrate clinically significant reduction in back pain/disability.    Baseline 09/03/20:20%    Time 8    Period Weeks    Status New    Target Date 10/29/20      PT LONG TERM GOAL #2   Title Pt will decrease worst back pain as reported on NPRS by at least 2 points in order to demonstrate clinically significant reduction in back pain.    Baseline 09/03/20: worst: 8/10    Time 8    Period Weeks    Status New    Target Date 10/29/20      PT LONG TERM GOAL #3   Title Pt will be able to walk for at least 1 hour without an increase in her back pain in order to be able to go to craft shows with her daughter without an increase in her pain    Baseline 09/03/20: 30 minutes    Time 8    Period Weeks    Status New    Target Date 10/29/20      PT LONG TERM GOAL #4   Title Pt will increase her FOTO score to at least 62 to demonstrate significant improvement in function related to her back pain    Baseline 09/03/20: 57    Time 8    Period Weeks    Status New    Target Date 10/29/20      PT LONG TERM GOAL #5   Title Pt will report at least 50% improvement in her symptoms in order to improve pain-free function at home with meal preparation and household responsibilities     Time 8    Period Weeks    Status New    Target Date 10/29/20                 Plan - 09/18/20 1330    Clinical Impression Statement Continued with L hip/low back stretching as well as abdominal/low back strengthening during session today. She denies any increase in pain during session. Introduced resisted gait on  Nautilus machine and pt struggles with lateral stepping. She is making excellent progress toward her goals. Patient encouraged to follow-up scheduled. Will progress directed and stabilization exercises at follow-up sessions.  Patient benefit from skilled PT services to address deficits and low back and left lower extremity pain in order to improve pain-free function at home.    Personal Factors and Comorbidities Age;Past/Current Experience;Time since onset of injury/illness/exacerbation;Comorbidity 2    Comorbidities osteoporosis, OA    Examination-Activity Limitations Stand;Transfers    Examination-Participation Restrictions Community Activity;Cleaning;Meal Prep;Shop    Stability/Clinical Decision Making Evolving/Moderate complexity    Rehab Potential Fair    PT Frequency 2x / week    PT Duration 8 weeks    PT Treatment/Interventions ADLs/Self Care Home Management;Aquatic Therapy;Biofeedback;Canalith Repostioning;Cryotherapy;Iontophoresis 4mg /ml Dexamethasone;Moist Heat;Electrical Stimulation;Traction;Ultrasound;DME Instruction;Gait training;Stair training;Functional mobility training;Therapeutic activities;Therapeutic exercise;Balance training;Neuromuscular re-education;Patient/family education;Manual techniques;Passive range of motion;Dry needling;Vestibular;Spinal Manipulations;Joint Manipulations    PT Next Visit Plan Review HEP, strengthening, stretching, STM as needed for pain control    PT Home Exercise Plan Access Code: [email or web address]    Consulted and Agree with Plan of Care Patient           Patient will benefit from skilled therapeutic intervention in order to improve  the following deficits and impairments:  Pain,Decreased strength  Visit Diagnosis: 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 Overactive bladder 03/14/2015  . Allergic rhinitis 02/11/2015  . Arthritis 02/11/2015  . Acid reflux 02/11/2015  . HLD (hyperlipidemia) 02/11/2015  . BP (high blood pressure) 02/11/2015  . 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 Urethral caruncle 02/11/2015  . OAB (overactive bladder) 02/11/2015  . 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 [place], [place], [place] Phone: [phone]   Fax:  [phone]  Name: Avaleigh Decuir MRN: Junius Argyle Date of Birth: 10/11/1931

## 2020-09-20 ENCOUNTER — Ambulatory Visit: Payer: Medicare Other

## 2020-09-20 ENCOUNTER — Other Ambulatory Visit: Payer: Self-pay

## 2020-09-20 DIAGNOSIS — G8929 Other chronic pain: Secondary | ICD-10-CM

## 2020-09-20 DIAGNOSIS — M6281 Muscle weakness (generalized): Secondary | ICD-10-CM

## 2020-09-20 DIAGNOSIS — M5442 Lumbago with sciatica, left side: Secondary | ICD-10-CM | POA: Diagnosis not present

## 2020-09-20 NOTE — Therapy (Signed)
[facility] [address] [place], [place], [address] Phone: [phone]   Fax:  [phone]  Physical Therapy Treatment  Patient Details  Name: April Weaver MRN: [id number] Date of Birth: 23-Oct-1931 Referring Provider (PT): Dr. Mariah Milling   Encounter Date: 09/20/2020   PT End of Session - 09/20/20 1054    Visit Number 6    Number of Visits 17    Date for PT Re-Evaluation 10/29/20    Authorization Type eval: 09/03/20    PT Start Time 1100    PT Stop Time 1143    PT Time Calculation (min) 43 min    Activity Tolerance Patient tolerated treatment well    Behavior During Therapy [facility] for tasks assessed/performed           Past Medical History:  Diagnosis Date  . Acid reflux 02/11/2015  . Allergic rhinitis 02/11/2015  . Arthritis 02/11/2015  . BP (high blood pressure) 02/11/2015  . 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 HLD (hyperlipidemia) 02/11/2015  . 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 APPENDECTOMY    . CARPAL TUNNEL RELEASE    . TONSILLECTOMY    . TUBAL LIGATION      There were no vitals filed for this visit.   Subjective Assessment - 09/20/20 1054    Subjective Pt reports that she is doing well today. She denies any resting back pain upon arrival. Denies any muscle soreness after the last session. She does complain of fatigue recently. No specific questions currently.    Pertinent History Pt reports that she has been having back pain for around 10 years. Insidious onset without any known trauma. No history of prior back injuries or surgeries. She is now status post 3 rounds of left transforminal lumbar spinal injections, the last one which occurred 07/15/2020. Since injections reports approximately 50% improvement in her symptoms. Her main aggravating factors are extended standing and walking. She is able to bend forward and put her hands on her knees to relieve  her symptoms or to sit down. Pain radiates down the LLE to the foot and she also has numbness in her L lateral thigh and calf as well as plantar surface of foot. Lumbar MRI on 05/29/20 showed multilevel degenerative changes of the lumbar spine with moderate spinal canal stenosis at L3-L4. Severe left neural foraminal narrowing at L4-5 and L5-S1.    Limitations Walking;Standing    How long can you sit comfortably? Unlimited    How long can you stand comfortably? 30 minutes    How long can you walk comfortably? 30 minutes    Diagnostic tests see history    Patient Stated Goals Pt would like to go to craft shows and walk    Currently in Pain? No/denies              TREATMENT   Ther-ex NuStep intervals L2/L6 x for warm-up during history with 45s intervals, therapist adjusting resistance; Hooklying posterior pelvic tilts 5s hold x 10 each; Hooklying SLR  withtransverse abdominis contraction x 10 on each side; Hooklying bridges x 10; Hooklying clams with manual resistance from therapist x 10; Hooklying adductor squeeze with manual resistance from therapist x 10; Hooklying bicycles x 10 BLE (very challenging for patient); [employer] L16 double leg squats 2 x 10; [employer] L16 single leg squats x 10 BLE;  Standing hip abduction with red tband around ankles 2 x 10 BLE;  Manual Therapy Bilateralhip SKTC, FABER and FADIR stretch x 45s each; Bilateralhamstring stretch x 45s, added alternating L ankle DF/PF when stretching L side for sciatic nerve mobility; Bilateral hip flexor stretch off side of table x 45s each; Supine thoracolumbar rotation stretch x 45s toward each side; STM to bilateral lumbar paraspinals as well as posterior and lateral hips with TheraBand roller;   Pt educated throughout session about proper posture and technique with exercises. Improved exercise technique, movement at target joints, use of target muscles after min to mod verbal, visual, tactile  cues.   Continued with L hip/low back stretching as well as abdominal/low back strengthening during session today. She denies any increase in pain during session. Introduced single and double leg squats on [employer] today as well as resisted hip abduction with red tband. She is making excellent progress toward her goals with improving hip and LE strength noted during session today.Patient encouraged to follow-up scheduled. Will progress directed and stabilization exercises at follow-up sessions. Patient benefit from skilled PT services to address deficits and low back and left lower extremity pain in order to improve pain-free function at home.                                    PT Short Term Goals - 09/03/20 1042      PT SHORT TERM GOAL #1   Title Pt will be independent with HEP in order to improve strength and decrease back pain in order to improve pain-free function at home.    Time 4    Period Weeks    Status New    Target Date 10/01/20             PT Long Term Goals - 09/06/20 0954      PT LONG TERM GOAL #1   Title Pt will decrease mODI score by at least 13 points in order demonstrate clinically significant reduction in back pain/disability.    Baseline 09/03/20:20%    Time 8    Period Weeks    Status New    Target Date 10/29/20      PT LONG TERM GOAL #2   Title Pt will decrease worst back pain as reported on NPRS by at least 2 points in order to demonstrate clinically significant reduction in back pain.    Baseline 09/03/20: worst: 8/10    Time 8    Period Weeks    Status New    Target Date 10/29/20      PT LONG TERM GOAL #3   Title Pt will be able to walk for at least 1 hour without an increase in her back pain in order to be able to go to craft shows with her daughter without an increase in her pain    Baseline 09/03/20: 30 minutes    Time 8    Period Weeks    Status New    Target Date 10/29/20      PT LONG TERM GOAL #4    Title Pt will increase her FOTO score to at least 62 to demonstrate significant improvement in function related to her back pain    Baseline 09/03/20: 57    Time 8    Period Weeks    Status New    Target Date 10/29/20      PT LONG TERM GOAL #5   Title Pt will report at least 50% improvement in her  symptoms in order to improve pain-free function at home with meal preparation and household responsibilities    Time 8    Period Weeks    Status New    Target Date 10/29/20                 Plan - 09/20/20 1054    Clinical Impression Statement Continued with L hip/low back stretching as well as abdominal/low back strengthening during session today. She denies any increase in pain during session. Introduced single and double leg squats on [employer] today as well as resisted hip abduction with red tband. She is making excellent progress toward her goals with improving hip and LE strength noted during session today. Patient encouraged to follow-up scheduled. Will progress directed and stabilization exercises at follow-up sessions.  Patient benefit from skilled PT services to address deficits and low back and left lower extremity pain in order to improve pain-free function at home.    Personal Factors and Comorbidities Age;Past/Current Experience;Time since onset of injury/illness/exacerbation;Comorbidity 2    Comorbidities osteoporosis, OA    Examination-Activity Limitations Stand;Transfers    Examination-Participation Restrictions Community Activity;Cleaning;Meal Prep;Shop    Stability/Clinical Decision Making Evolving/Moderate complexity    Rehab Potential Fair    PT Frequency 2x / week    PT Duration 8 weeks    PT Treatment/Interventions ADLs/Self Care Home Management;Aquatic Therapy;Biofeedback;Canalith Repostioning;Cryotherapy;Iontophoresis 4mg /ml Dexamethasone;Moist Heat;Electrical Stimulation;Traction;Ultrasound;DME Instruction;Gait training;Stair training;Functional mobility  training;Therapeutic activities;Therapeutic exercise;Balance training;Neuromuscular re-education;Patient/family education;Manual techniques;Passive range of motion;Dry needling;Vestibular;Spinal Manipulations;Joint Manipulations    PT Next Visit Plan Review HEP, strengthening, stretching, STM as needed for pain control    PT Home Exercise Plan Access Code: [email or web address]    Consulted and Agree with Plan of Care Patient           Patient will benefit from skilled therapeutic intervention in order to improve the following deficits and impairments:  Pain,Decreased strength  Visit Diagnosis: 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 Overactive bladder 03/14/2015  . Allergic rhinitis 02/11/2015  . Arthritis 02/11/2015  . Acid reflux 02/11/2015  . HLD (hyperlipidemia) 02/11/2015  . BP (high blood pressure) 02/11/2015  . 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 Urethral caruncle 02/11/2015  . OAB (overactive bladder) 02/11/2015  . 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 [place], [place], [place] Phone: [phone]   Fax:  [phone]  Name: April Weaver MRN: Junius Argyle Date of Birth: 1931/07/08

## 2020-09-23 ENCOUNTER — Ambulatory Visit: Payer: Medicare Other

## 2020-09-23 ENCOUNTER — Other Ambulatory Visit: Payer: Self-pay

## 2020-09-23 DIAGNOSIS — G8929 Other chronic pain: Secondary | ICD-10-CM

## 2020-09-23 DIAGNOSIS — M6281 Muscle weakness (generalized): Secondary | ICD-10-CM

## 2020-09-23 DIAGNOSIS — M5442 Lumbago with sciatica, left side: Secondary | ICD-10-CM | POA: Diagnosis not present

## 2020-09-23 NOTE — Therapy (Signed)
[facility] [address] [place], [place], [address] Phone: [phone]   Fax:  [phone]  Physical Therapy Treatment  Patient Details  Name: April Weaver MRN: [id number] Date of Birth: 1932-03-01 Referring Provider (PT): Dr. Mariah Milling   Encounter Date: 09/23/2020   PT End of Session - 09/23/20 0923    Visit Number 7    Number of Visits 17    Date for PT Re-Evaluation 10/29/20    Authorization Type eval: 09/03/20    PT Start Time 0930    PT Stop Time 1015    PT Time Calculation (min) 45 min    Activity Tolerance Patient tolerated treatment well    Behavior During Therapy [facility] for tasks assessed/performed           Past Medical History:  Diagnosis Date  . Acid reflux 02/11/2015  . Allergic rhinitis 02/11/2015  . Arthritis 02/11/2015  . BP (high blood pressure) 02/11/2015  . 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 HLD (hyperlipidemia) 02/11/2015  . 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 APPENDECTOMY    . CARPAL TUNNEL RELEASE    . TONSILLECTOMY    . TUBAL LIGATION      There were no vitals filed for this visit.   Subjective Assessment - 09/23/20 0923    Subjective Pt reports that she is doing well today. She denies any resting back pain upon arrival. Reports that she was very sore after the last session. Continues to complain of intermittent fatigue. No specific questions currently.    Pertinent History Pt reports that she has been having back pain for around 10 years. Insidious onset without any known trauma. No history of prior back injuries or surgeries. She is now status post 3 rounds of left transforminal lumbar spinal injections, the last one which occurred 07/15/2020. Since injections reports approximately 50% improvement in her symptoms. Her main aggravating factors are extended standing and walking. She is able to bend forward and put her hands on her  knees to relieve her symptoms or to sit down. Pain radiates down the LLE to the foot and she also has numbness in her L lateral thigh and calf as well as plantar surface of foot. Lumbar MRI on 05/29/20 showed multilevel degenerative changes of the lumbar spine with moderate spinal canal stenosis at L3-L4. Severe left neural foraminal narrowing at L4-5 and L5-S1.    Limitations Walking;Standing    How long can you sit comfortably? Unlimited    How long can you stand comfortably? 30 minutes    How long can you walk comfortably? 30 minutes    Diagnostic tests see history    Patient Stated Goals Pt would like to go to craft shows and walk    Currently in Pain? No/denies              TREATMENT   Ther-ex NuStep intervals L2/L6 x (1 minute warm-up/cool-down) for warm-up during history with45s intervals, therapist adjusting resistance; Hooklyingposterior pelvic tilts 5s hold x 10each; Hooklying SLR withtransverse abdominis contraction2 x 10 on each side; Hooklying bridges 2 x 10; Sidelying straight leg hip abduction 2 x 10 BLE; Sidelying clams with manual resistance from therapist 2 x 10 BLE; Sidelying reverse clams with manual resistance from therapist 2 x 10 BLE; Sit to stand from low mat table with 6# med ball overhead press 2 x 10; Seated manually resisted trunk rotation with pt hold cane at  shoulder height and therapist providing R/L resistance 2 x 30s; Seated manually resisted trunk flexion/extension with pt hold cane at shoulder height and therapist providing superior/inferior resistance 2 x 30s; Seated chest press with cane and manual resistance from therapist 2 x 10; Seated row with cane and manual resistance from therapist 2 x 10;   Pt educated throughout session about proper posture and technique with exercises. Improved exercise technique, movement at target joints, use of target muscles after min to mod verbal, visual, tactile cues.   Continued with L  hip/low back stretching as well as abdominal/low back strengtheningduring session today.She denies any increase in pain during session. She is able to complete sit to stands with overhead weighted ball press with appropriate challenge. Avoided too many squats due to significant fatigue in quads after last therapy session. She is making excellent progress toward her goals with improving hip and LE strength.Patient encouraged to follow-up scheduled. Will progress directed and stabilization exercises at follow-up sessions. Patient benefit from skilled PT services to address deficits and low back and left lower extremity pain in order to improve pain-free function at home.                           PT Short Term Goals - 09/03/20 1042      PT SHORT TERM GOAL #1   Title Pt will be independent with HEP in order to improve strength and decrease back pain in order to improve pain-free function at home.    Time 4    Period Weeks    Status New    Target Date 10/01/20             PT Long Term Goals - 09/06/20 0954      PT LONG TERM GOAL #1   Title Pt will decrease mODI score by at least 13 points in order demonstrate clinically significant reduction in back pain/disability.    Baseline 09/03/20:20%    Time 8    Period Weeks    Status New    Target Date 10/29/20      PT LONG TERM GOAL #2   Title Pt will decrease worst back pain as reported on NPRS by at least 2 points in order to demonstrate clinically significant reduction in back pain.    Baseline 09/03/20: worst: 8/10    Time 8    Period Weeks    Status New    Target Date 10/29/20      PT LONG TERM GOAL #3   Title Pt will be able to walk for at least 1 hour without an increase in her back pain in order to be able to go to craft shows with her daughter without an increase in her pain    Baseline 09/03/20: 30 minutes    Time 8    Period Weeks    Status New    Target Date 10/29/20      PT LONG TERM GOAL #4    Title Pt will increase her FOTO score to at least 62 to demonstrate significant improvement in function related to her back pain    Baseline 09/03/20: 57    Time 8    Period Weeks    Status New    Target Date 10/29/20      PT LONG TERM GOAL #5   Title Pt will report at least 50% improvement in her symptoms in order to improve pain-free function at home with meal preparation  and household responsibilities    Time 8    Period Weeks    Status New    Target Date 10/29/20                 Plan - 09/23/20 0923    Clinical Impression Statement Continued with L hip/low back stretching as well as abdominal/low back strengthening during session today. She denies any increase in pain during session. She is able to complete sit to stands with overhead weighted ball press with appropriate challenge. Avoided too many squats due to significant fatigue in quads after last therapy session. She is making excellent progress toward her goals with improving hip and LE strength. Patient encouraged to follow-up scheduled. Will progress directed and stabilization exercises at follow-up sessions.  Patient benefit from skilled PT services to address deficits and low back and left lower extremity pain in order to improve pain-free function at home    Personal Factors and Comorbidities Age;Past/Current Experience;Time since onset of injury/illness/exacerbation;Comorbidity 2    Comorbidities osteoporosis, OA    Examination-Activity Limitations Stand;Transfers    Examination-Participation Restrictions Community Activity;Cleaning;Meal Prep;Shop    Stability/Clinical Decision Making Evolving/Moderate complexity    Rehab Potential Fair    PT Frequency 2x / week    PT Duration 8 weeks    PT Treatment/Interventions ADLs/Self Care Home Management;Aquatic Therapy;Biofeedback;Canalith Repostioning;Cryotherapy;Iontophoresis 4mg /ml Dexamethasone;Moist Heat;Electrical Stimulation;Traction;Ultrasound;DME Instruction;Gait  training;Stair training;Functional mobility training;Therapeutic activities;Therapeutic exercise;Balance training;Neuromuscular re-education;Patient/family education;Manual techniques;Passive range of motion;Dry needling;Vestibular;Spinal Manipulations;Joint Manipulations    PT Next Visit Plan Review HEP, strengthening, stretching, STM as needed for pain control    PT Home Exercise Plan Access Code: [email or web address]    Consulted and Agree with Plan of Care Patient           Patient will benefit from skilled therapeutic intervention in order to improve the following deficits and impairments:  Pain,Decreased strength  Visit Diagnosis: 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 Overactive bladder 03/14/2015  . Allergic rhinitis 02/11/2015  . Arthritis 02/11/2015  . Acid reflux 02/11/2015  . HLD (hyperlipidemia) 02/11/2015  . BP (high blood pressure) 02/11/2015  . 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 Urethral caruncle 02/11/2015  . OAB (overactive bladder) 02/11/2015  . 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 [place], [place], [place] Phone: [phone]   Fax:  [phone]  Name: April Weaver MRN: Junius Argyle Date of Birth: 1931/08/15

## 2020-09-25 ENCOUNTER — Ambulatory Visit: Payer: Medicare Other

## 2020-09-25 ENCOUNTER — Other Ambulatory Visit: Payer: Self-pay

## 2020-09-25 DIAGNOSIS — G8929 Other chronic pain: Secondary | ICD-10-CM

## 2020-09-25 DIAGNOSIS — M5442 Lumbago with sciatica, left side: Secondary | ICD-10-CM | POA: Diagnosis not present

## 2020-09-25 DIAGNOSIS — M6281 Muscle weakness (generalized): Secondary | ICD-10-CM

## 2020-09-25 NOTE — Therapy (Signed)
[facility] [address] [place], [place], [address] Phone: [phone]   Fax:  [phone]  Physical Therapy Treatment  Patient Details  Name: April Weaver MRN: [id number] Date of Birth: 1932/02/17 Referring Provider (PT): Dr. Mariah Milling   Encounter Date: 09/25/2020   PT End of Session - 09/25/20 0941    Visit Number 8    Number of Visits 17    Date for PT Re-Evaluation 10/29/20    Authorization Type eval: 09/03/20    PT Start Time 0932    PT Stop Time 1015    PT Time Calculation (min) 43 min    Activity Tolerance Patient tolerated treatment well    Behavior During Therapy [facility] for tasks assessed/performed           Past Medical History:  Diagnosis Date  . Acid reflux 02/11/2015  . Allergic rhinitis 02/11/2015  . Arthritis 02/11/2015  . BP (high blood pressure) 02/11/2015  . 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 HLD (hyperlipidemia) 02/11/2015  . 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 APPENDECTOMY    . CARPAL TUNNEL RELEASE    . TONSILLECTOMY    . TUBAL LIGATION      There were no vitals filed for this visit.   Subjective Assessment - 09/25/20 0940    Subjective Pt reports that she is doing well today. She denies any resting back pain upon arrival. She denies any soreness after her last therapy session. Fatigue continues to vary. No specific questions currently.    Pertinent History Pt reports that she has been having back pain for around 10 years. Insidious onset without any known trauma. No history of prior back injuries or surgeries. She is now status post 3 rounds of left transforminal lumbar spinal injections, the last one which occurred 07/15/2020. Since injections reports approximately 50% improvement in her symptoms. Her main aggravating factors are extended standing and walking. She is able to bend forward and put her hands on her knees to relieve her  symptoms or to sit down. Pain radiates down the LLE to the foot and she also has numbness in her L lateral thigh and calf as well as plantar surface of foot. Lumbar MRI on 05/29/20 showed multilevel degenerative changes of the lumbar spine with moderate spinal canal stenosis at L3-L4. Severe left neural foraminal narrowing at L4-5 and L5-S1.    Limitations Walking;Standing    How long can you sit comfortably? Unlimited    How long can you stand comfortably? 30 minutes    How long can you walk comfortably? 30 minutes    Diagnostic tests see history    Patient Stated Goals Pt would like to go to craft shows and walk    Currently in Pain? No/denies               TREATMENT   Ther-ex SciFit L4 x 6 minutes during interval history (3 minutes unbilled); Hooklyingposterior pelvic tilts 5s hold x 10each; Hooklying bridges with red tband around knees to encourage hip abduction x 10; Hooklying bridge holds with alternating LE marching, red tband around knees x 10 BLE; Hooklying SLR withtransverse abdominis contraction2 x 10 on each side; Sidelying straight leg hip abduction with red tband around knees 2 x 10 BLE; Sidelying clams with red tband around knees from therapist 2 x 10 BLE; Sidelying reverse clams  2 x 10 BLE; Sit to stand from low mat table with 6#  med ball overhead press 2 x 10;   Manual Therapy Bilateralhip SKTC, FABER and FADIR stretch x 45s each; Bilateral hip flexor stretch off side of table x 45s each; Supine thoracolumbar rotation stretch x 45s toward each side; STM to bilateral lumbar paraspinals as well as posterior and lateral hips with TheraBand roller;   Pt educated throughout session about proper posture and technique with exercises. Improved exercise technique, movement at target joints, use of target muscles after min to mod verbal, visual, tactile cues.   Continued with L hip/low back stretching as well as abdominal/low back strengtheningduring  session today.She denies any increase in pain during session. She is able to complete sit to stands with overhead weighted ball press with appropriate challenge. She is making excellent progress toward her goals with improving hip and LE strength.Patient encouraged to follow-up as scheduled. Will progress stabilization exercises at follow-up sessions. Patient will benefit from skilled PT services to address deficits in low back and left lower extremity pain in order to improve pain-free function at home.                          PT Short Term Goals - 09/03/20 1042      PT SHORT TERM GOAL #1   Title Pt will be independent with HEP in order to improve strength and decrease back pain in order to improve pain-free function at home.    Time 4    Period Weeks    Status New    Target Date 10/01/20             PT Long Term Goals - 09/06/20 0954      PT LONG TERM GOAL #1   Title Pt will decrease mODI score by at least 13 points in order demonstrate clinically significant reduction in back pain/disability.    Baseline 09/03/20:20%    Time 8    Period Weeks    Status New    Target Date 10/29/20      PT LONG TERM GOAL #2   Title Pt will decrease worst back pain as reported on NPRS by at least 2 points in order to demonstrate clinically significant reduction in back pain.    Baseline 09/03/20: worst: 8/10    Time 8    Period Weeks    Status New    Target Date 10/29/20      PT LONG TERM GOAL #3   Title Pt will be able to walk for at least 1 hour without an increase in her back pain in order to be able to go to craft shows with her daughter without an increase in her pain    Baseline 09/03/20: 30 minutes    Time 8    Period Weeks    Status New    Target Date 10/29/20      PT LONG TERM GOAL #4   Title Pt will increase her FOTO score to at least 62 to demonstrate significant improvement in function related to her back pain    Baseline 09/03/20: 57    Time 8     Period Weeks    Status New    Target Date 10/29/20      PT LONG TERM GOAL #5   Title Pt will report at least 50% improvement in her symptoms in order to improve pain-free function at home with meal preparation and household responsibilities    Time 8    Period Weeks  Status New    Target Date 10/29/20                 Plan - 09/25/20 0942    Clinical Impression Statement Continued with L hip/low back stretching as well as abdominal/low back strengthening during session today. She denies any increase in pain during session. She is able to complete sit to stands with overhead weighted ball press with appropriate challenge. She is making excellent progress toward her goals with improving hip and LE strength. Patient encouraged to follow-up as scheduled. Will progress stabilization exercises at follow-up sessions.  Patient will benefit from skilled PT services to address deficits in low back and left lower extremity pain in order to improve pain-free function at home.    Personal Factors and Comorbidities Age;Past/Current Experience;Time since onset of injury/illness/exacerbation;Comorbidity 2    Comorbidities osteoporosis, OA    Examination-Activity Limitations Stand;Transfers    Examination-Participation Restrictions Community Activity;Cleaning;Meal Prep;Shop    Stability/Clinical Decision Making Evolving/Moderate complexity    Rehab Potential Fair    PT Frequency 2x / week    PT Duration 8 weeks    PT Treatment/Interventions ADLs/Self Care Home Management;Aquatic Therapy;Biofeedback;Canalith Repostioning;Cryotherapy;Iontophoresis 4mg /ml Dexamethasone;Moist Heat;Electrical Stimulation;Traction;Ultrasound;DME Instruction;Gait training;Stair training;Functional mobility training;Therapeutic activities;Therapeutic exercise;Balance training;Neuromuscular re-education;Patient/family education;Manual techniques;Passive range of motion;Dry needling;Vestibular;Spinal Manipulations;Joint  Manipulations    PT Next Visit Plan Review HEP, strengthening, stretching, STM as needed for pain control    PT Home Exercise Plan Access Code: [email or web address]    Consulted and Agree with Plan of Care Patient           Patient will benefit from skilled therapeutic intervention in order to improve the following deficits and impairments:  Pain,Decreased strength  Visit Diagnosis: 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 Overactive bladder 03/14/2015  . Allergic rhinitis 02/11/2015  . Arthritis 02/11/2015  . Acid reflux 02/11/2015  . HLD (hyperlipidemia) 02/11/2015  . BP (high blood pressure) 02/11/2015  . 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 Urethral caruncle 02/11/2015  . OAB (overactive bladder) 02/11/2015  . 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 [place], [place], [place] Phone: [phone]   Fax:  [phone]  Name: April Weaver MRN: Junius Argyle Date of Birth: 10-06-1931

## 2020-09-30 NOTE — Patient Instructions (Incomplete)
TREATMENT   Ther-ex SciFit L4 x 6 minutes during interval history (3 minutes unbilled); Hooklyingposterior pelvic tilts 5s hold x 10each; Hooklying bridges with red tband around knees to encourage hip abduction x 10; Hooklying bridge holds with alternating LE marching, red tband around knees x 10 BLE; Hooklying SLR withtransverse abdominis contraction2 x10on each side; Sidelying straight leg hip abduction with red tband around knees 2 x 10 BLE; Sidelying clams with red tband around knees from therapist 2 x 10 BLE; Sidelying reverse clams  2 x 10 BLE; Sit to stand from low mat table with 6# med ball overhead press 2 x 10;   Manual Therapy Bilateralhip SKTC, FABER and FADIR stretch x 45s each; Bilateral hip flexor stretch off side of table x 45s each; Supine thoracolumbar rotation stretch x 45s toward each side; STM to bilateral lumbar paraspinals as well as posterior and lateral hips with TheraBandroller;   Pt educated throughout session about proper posture and technique with exercises. Improved exercise technique, movement at target joints, use of target muscles after min to mod verbal, visual, tactile cues.   Continued with L hip/low back stretching as well as abdominal/low back strengtheningduring session today.She denies any increase in pain during session. She is able to complete sit to stands with overhead weighted ball press with appropriate challenge. She is making excellent progress toward her goalswith improving hip and LE strength.Patient encouraged to follow-up as scheduled. Will progress stabilization exercises at follow-up sessions. Patient will benefit from skilled PT services to address deficits in low back and left lower extremity pain in order to improve pain-free function at home.

## 2020-10-02 ENCOUNTER — Ambulatory Visit: Payer: Medicare Other

## 2020-10-02 DIAGNOSIS — G8929 Other chronic pain: Secondary | ICD-10-CM

## 2020-10-02 DIAGNOSIS — M6281 Muscle weakness (generalized): Secondary | ICD-10-CM

## 2020-10-04 ENCOUNTER — Emergency Department: Payer: Medicare Other

## 2020-10-04 ENCOUNTER — Encounter: Payer: Self-pay | Admitting: Family Medicine

## 2020-10-04 ENCOUNTER — Ambulatory Visit: Payer: Medicare Other

## 2020-10-04 ENCOUNTER — Inpatient Hospital Stay
Admission: EM | Admit: 2020-10-04 | Discharge: 2020-10-17 | DRG: 871 | Disposition: A | Discharge status: Home Health | Payer: Medicare Other | Attending: Internal Medicine | Admitting: Internal Medicine

## 2020-10-04 ENCOUNTER — Other Ambulatory Visit: Payer: Self-pay

## 2020-10-04 DIAGNOSIS — R1031 Right lower quadrant pain: Secondary | ICD-10-CM | POA: Diagnosis present

## 2020-10-04 DIAGNOSIS — R2681 Unsteadiness on feet: Secondary | ICD-10-CM | POA: Diagnosis not present

## 2020-10-04 DIAGNOSIS — R5381 Other malaise: Secondary | ICD-10-CM

## 2020-10-04 DIAGNOSIS — A419 Sepsis, unspecified organism: Secondary | ICD-10-CM

## 2020-10-04 DIAGNOSIS — N2889 Other specified disorders of kidney and ureter: Secondary | ICD-10-CM | POA: Diagnosis present

## 2020-10-04 DIAGNOSIS — E860 Dehydration: Secondary | ICD-10-CM | POA: Diagnosis present

## 2020-10-04 DIAGNOSIS — E785 Hyperlipidemia, unspecified: Secondary | ICD-10-CM | POA: Diagnosis present

## 2020-10-04 DIAGNOSIS — R569 Unspecified convulsions: Secondary | ICD-10-CM | POA: Diagnosis present

## 2020-10-04 DIAGNOSIS — A4189 Other specified sepsis: Principal | ICD-10-CM | POA: Diagnosis present

## 2020-10-04 DIAGNOSIS — I129 Hypertensive chronic kidney disease with stage 1 through stage 4 chronic kidney disease, or unspecified chronic kidney disease: Secondary | ICD-10-CM | POA: Diagnosis present

## 2020-10-04 DIAGNOSIS — Z880 Allergy status to penicillin: Secondary | ICD-10-CM

## 2020-10-04 DIAGNOSIS — R7989 Other specified abnormal findings of blood chemistry: Secondary | ICD-10-CM | POA: Diagnosis present

## 2020-10-04 DIAGNOSIS — I248 Other forms of acute ischemic heart disease: Secondary | ICD-10-CM | POA: Diagnosis present

## 2020-10-04 DIAGNOSIS — J9601 Acute respiratory failure with hypoxia: Secondary | ICD-10-CM

## 2020-10-04 DIAGNOSIS — J9611 Chronic respiratory failure with hypoxia: Secondary | ICD-10-CM | POA: Diagnosis not present

## 2020-10-04 DIAGNOSIS — R778 Other specified abnormalities of plasma proteins: Secondary | ICD-10-CM

## 2020-10-04 DIAGNOSIS — Z7983 Long term (current) use of bisphosphonates: Secondary | ICD-10-CM

## 2020-10-04 DIAGNOSIS — M810 Age-related osteoporosis without current pathological fracture: Secondary | ICD-10-CM | POA: Diagnosis present

## 2020-10-04 DIAGNOSIS — J309 Allergic rhinitis, unspecified: Secondary | ICD-10-CM | POA: Diagnosis present

## 2020-10-04 DIAGNOSIS — G8929 Other chronic pain: Secondary | ICD-10-CM | POA: Diagnosis present

## 2020-10-04 DIAGNOSIS — M1990 Unspecified osteoarthritis, unspecified site: Secondary | ICD-10-CM | POA: Diagnosis present

## 2020-10-04 DIAGNOSIS — N179 Acute kidney failure, unspecified: Secondary | ICD-10-CM | POA: Diagnosis present

## 2020-10-04 DIAGNOSIS — N3281 Overactive bladder: Secondary | ICD-10-CM | POA: Diagnosis present

## 2020-10-04 DIAGNOSIS — R6520 Severe sepsis without septic shock: Secondary | ICD-10-CM | POA: Diagnosis present

## 2020-10-04 DIAGNOSIS — G40909 Epilepsy, unspecified, not intractable, without status epilepticus: Secondary | ICD-10-CM | POA: Diagnosis not present

## 2020-10-04 DIAGNOSIS — R54 Age-related physical debility: Secondary | ICD-10-CM | POA: Diagnosis not present

## 2020-10-04 DIAGNOSIS — Z887 Allergy status to serum and vaccine status: Secondary | ICD-10-CM

## 2020-10-04 DIAGNOSIS — I10 Essential (primary) hypertension: Secondary | ICD-10-CM | POA: Diagnosis present

## 2020-10-04 DIAGNOSIS — J9621 Acute and chronic respiratory failure with hypoxia: Secondary | ICD-10-CM | POA: Diagnosis present

## 2020-10-04 DIAGNOSIS — K219 Gastro-esophageal reflux disease without esophagitis: Secondary | ICD-10-CM | POA: Diagnosis present

## 2020-10-04 DIAGNOSIS — Z7982 Long term (current) use of aspirin: Secondary | ICD-10-CM

## 2020-10-04 DIAGNOSIS — R000 Tachycardia, unspecified: Secondary | ICD-10-CM | POA: Diagnosis present

## 2020-10-04 DIAGNOSIS — N1832 Chronic kidney disease, stage 3b: Secondary | ICD-10-CM | POA: Diagnosis present

## 2020-10-04 DIAGNOSIS — F419 Anxiety disorder, unspecified: Secondary | ICD-10-CM | POA: Diagnosis present

## 2020-10-04 DIAGNOSIS — J159 Unspecified bacterial pneumonia: Secondary | ICD-10-CM | POA: Diagnosis present

## 2020-10-04 DIAGNOSIS — U071 COVID-19: Secondary | ICD-10-CM | POA: Diagnosis present

## 2020-10-04 DIAGNOSIS — Z888 Allergy status to other drugs, medicaments and biological substances status: Secondary | ICD-10-CM | POA: Diagnosis not present

## 2020-10-04 DIAGNOSIS — Z6820 Body mass index (BMI) 20.0-20.9, adult: Secondary | ICD-10-CM

## 2020-10-04 DIAGNOSIS — N183 Chronic kidney disease, stage 3 unspecified: Secondary | ICD-10-CM | POA: Diagnosis present

## 2020-10-04 DIAGNOSIS — E43 Unspecified severe protein-calorie malnutrition: Secondary | ICD-10-CM | POA: Diagnosis present

## 2020-10-04 DIAGNOSIS — Z79899 Other long term (current) drug therapy: Secondary | ICD-10-CM

## 2020-10-04 DIAGNOSIS — N189 Chronic kidney disease, unspecified: Secondary | ICD-10-CM

## 2020-10-04 DIAGNOSIS — Z803 Family history of malignant neoplasm of breast: Secondary | ICD-10-CM

## 2020-10-04 DIAGNOSIS — E872 Acidosis: Secondary | ICD-10-CM | POA: Diagnosis present

## 2020-10-04 DIAGNOSIS — J1282 Pneumonia due to coronavirus disease 2019: Secondary | ICD-10-CM | POA: Diagnosis present

## 2020-10-04 DIAGNOSIS — Z5329 Procedure and treatment not carried out because of patient's decision for other reasons: Secondary | ICD-10-CM | POA: Diagnosis not present

## 2020-10-04 DIAGNOSIS — R7982 Elevated C-reactive protein (CRP): Secondary | ICD-10-CM | POA: Diagnosis present

## 2020-10-04 LAB — CBC WITH DIFFERENTIAL/PLATELET
Abs Immature Granulocytes: 0.1 10*3/uL — ABNORMAL HIGH (ref 0.00–0.07)
Basophils Absolute: 0 10*3/uL (ref 0.0–0.1)
Basophils Relative: 0 %
Eosinophils Absolute: 0 10*3/uL (ref 0.0–0.5)
Eosinophils Relative: 0 %
HCT: 33.2 % — ABNORMAL LOW (ref 36.0–46.0)
Hemoglobin: 11.2 g/dL — ABNORMAL LOW (ref 12.0–15.0)
Immature Granulocytes: 1 %
Lymphocytes Relative: 4 %
Lymphs Abs: 0.3 10*3/uL — ABNORMAL LOW (ref 0.7–4.0)
MCH: 31.8 pg (ref 26.0–34.0)
MCHC: 33.7 g/dL (ref 30.0–36.0)
MCV: 94.3 fL (ref 80.0–100.0)
Monocytes Absolute: 0.2 10*3/uL (ref 0.1–1.0)
Monocytes Relative: 2 %
Neutro Abs: 7.6 10*3/uL (ref 1.7–7.7)
Neutrophils Relative %: 93 %
Platelets: 196 10*3/uL (ref 150–400)
RBC: 3.52 MIL/uL — ABNORMAL LOW (ref 3.87–5.11)
RDW: 12.5 % (ref 11.5–15.5)
WBC: 8.2 10*3/uL (ref 4.0–10.5)
nRBC: 0 % (ref 0.0–0.2)

## 2020-10-04 LAB — URINALYSIS, COMPLETE (UACMP) WITH MICROSCOPIC
Bacteria, UA: NONE SEEN
Bilirubin Urine: NEGATIVE
Glucose, UA: NEGATIVE mg/dL
Ketones, ur: NEGATIVE mg/dL
Leukocytes,Ua: NEGATIVE
Nitrite: NEGATIVE
Protein, ur: 30 mg/dL — AB
Specific Gravity, Urine: 1.016 (ref 1.005–1.030)
pH: 6 (ref 5.0–8.0)

## 2020-10-04 LAB — BASIC METABOLIC PANEL
Anion gap: 15 (ref 5–15)
BUN: 63 mg/dL — ABNORMAL HIGH (ref 8–23)
CO2: 18 mmol/L — ABNORMAL LOW (ref 22–32)
Calcium: 8.5 mg/dL — ABNORMAL LOW (ref 8.9–10.3)
Chloride: 103 mmol/L (ref 98–111)
Creatinine, Ser: 2.15 mg/dL — ABNORMAL HIGH (ref 0.44–1.00)
GFR, Estimated: 22 mL/min — ABNORMAL LOW (ref >60)
Glucose, Bld: 190 mg/dL — ABNORMAL HIGH (ref 70–99)
Potassium: 3.8 mmol/L (ref 3.5–5.1)
Sodium: 136 mmol/L (ref 135–145)

## 2020-10-04 LAB — LACTIC ACID, PLASMA
Lactic Acid, Venous: 2.3 mmol/L (ref 0.5–1.9)
Lactic Acid, Venous: 6.9 mmol/L (ref 0.5–1.9)
Lactic Acid, Venous: 2.4 mmol/L (ref 0.5–1.9)
Lactic Acid, Venous: 1.3 mmol/L (ref 0.5–1.9)

## 2020-10-04 LAB — PROTIME-INR
INR: 1.1 (ref 0.8–1.2)
Prothrombin Time: 14.3 seconds (ref 11.4–15.2)

## 2020-10-04 LAB — APTT: aPTT: 27 seconds (ref 24–36)

## 2020-10-04 LAB — TROPONIN I (HIGH SENSITIVITY)
Troponin I (High Sensitivity): 24 ng/L — ABNORMAL HIGH (ref <18)
Troponin I (High Sensitivity): 22 ng/L — ABNORMAL HIGH (ref <18)

## 2020-10-04 LAB — PROCALCITONIN: Procalcitonin: 4.58 ng/mL

## 2020-10-04 MED ORDER — ONDANSETRON HCL 4 MG/2ML IJ SOLN: 4.0000 mg | Freq: Four times a day (QID) | INTRAMUSCULAR | Status: DC | PRN

## 2020-10-04 MED ORDER — ASPIRIN EC 81 MG PO TBEC
81.0000 mg | DELAYED_RELEASE_TABLET | Freq: Once | ORAL | Status: AC
  Administered 2020-10-04: 81 mg via ORAL
  Filled 2020-10-04: qty 1

## 2020-10-04 MED ORDER — ACETAMINOPHEN 325 MG PO TABS
650.0000 mg | ORAL_TABLET | Freq: Four times a day (QID) | ORAL | Status: DC | PRN
  Administered 2020-10-04 – 2020-10-17 (×7): 650 mg via ORAL
  Filled 2020-10-04 (×9): qty 2

## 2020-10-04 MED ORDER — SODIUM CHLORIDE 0.9 % IV SOLN
100.0000 mg | Freq: Every day | INTRAVENOUS | Status: DC
  Filled 2020-10-04: qty 20

## 2020-10-04 MED ORDER — MIRABEGRON ER 50 MG PO TB24
50.0000 mg | ORAL_TABLET | Freq: Every day | ORAL | Status: DC
  Administered 2020-10-05 – 2020-10-17 (×13): 50 mg via ORAL
  Filled 2020-10-04 (×13): qty 1

## 2020-10-04 MED ORDER — SODIUM CHLORIDE 0.9 % IV SOLN
200.0000 mg | Freq: Once | INTRAVENOUS | Status: DC
  Filled 2020-10-04: qty 40

## 2020-10-04 MED ORDER — AMLODIPINE BESYLATE 5 MG PO TABS
5.0000 mg | ORAL_TABLET | Freq: Every day | ORAL | Status: DC
  Administered 2020-10-05: 5 mg via ORAL
  Filled 2020-10-04: qty 1

## 2020-10-04 MED ORDER — PRAVASTATIN SODIUM 20 MG PO TABS
20.0000 mg | ORAL_TABLET | Freq: Every day | ORAL | Status: DC
  Administered 2020-10-05 – 2020-10-06 (×2): 20 mg via ORAL
  Filled 2020-10-04 (×2): qty 1

## 2020-10-04 MED ORDER — TRAZODONE HCL 50 MG PO TABS
25.0000 mg | ORAL_TABLET | Freq: Every evening | ORAL | Status: DC | PRN
  Administered 2020-10-08 – 2020-10-16 (×5): 25 mg via ORAL
  Filled 2020-10-04 (×5): qty 1

## 2020-10-04 MED ORDER — IPRATROPIUM-ALBUTEROL 20-100 MCG/ACT IN AERS
1.0000 | INHALATION_SPRAY | Freq: Four times a day (QID) | RESPIRATORY_TRACT | Status: DC
  Administered 2020-10-04 – 2020-10-17 (×49): 1 via RESPIRATORY_TRACT
  Filled 2020-10-04: qty 4

## 2020-10-04 MED ORDER — PANTOPRAZOLE SODIUM 40 MG PO TBEC
40.0000 mg | DELAYED_RELEASE_TABLET | Freq: Every day | ORAL | Status: DC
  Administered 2020-10-05 – 2020-10-17 (×13): 40 mg via ORAL
  Filled 2020-10-04 (×13): qty 1

## 2020-10-04 MED ORDER — LACTATED RINGERS IV SOLN: INTRAVENOUS | Status: DC

## 2020-10-04 MED ORDER — LACTATED RINGERS IV BOLUS
500.0000 mL | Freq: Once | INTRAVENOUS | Status: AC
  Administered 2020-10-04: 500 mL via INTRAVENOUS

## 2020-10-04 MED ORDER — ONDANSETRON HCL 4 MG PO TABS
4.0000 mg | ORAL_TABLET | Freq: Four times a day (QID) | ORAL | Status: DC | PRN
  Administered 2020-10-05: 15:00:00 4 mg via ORAL
  Filled 2020-10-04: qty 1

## 2020-10-04 MED ORDER — LACTATED RINGERS IV BOLUS
1000.0000 mL | Freq: Once | INTRAVENOUS | Status: AC
  Administered 2020-10-04: 1000 mL via INTRAVENOUS

## 2020-10-04 MED ORDER — SODIUM CHLORIDE 0.9 % IV SOLN: 100.0000 mg | Freq: Every day | INTRAVENOUS | Status: DC

## 2020-10-04 MED ORDER — DEXAMETHASONE 4 MG PO TABS
6.0000 mg | ORAL_TABLET | ORAL | Status: DC
  Administered 2020-10-04 – 2020-10-08 (×5): 6 mg via ORAL
  Filled 2020-10-04 (×5): qty 2

## 2020-10-04 MED ORDER — ENOXAPARIN SODIUM 30 MG/0.3ML IJ SOSY
30.0000 mg | PREFILLED_SYRINGE | INTRAMUSCULAR | Status: DC
  Administered 2020-10-04 – 2020-10-11 (×8): 30 mg via SUBCUTANEOUS
  Filled 2020-10-04 (×8): qty 0.3

## 2020-10-04 MED ORDER — POLYETHYLENE GLYCOL 3350 17 G PO PACK
17.0000 g | PACK | Freq: Every day | ORAL | Status: DC | PRN
  Administered 2020-10-06: 17 g via ORAL
  Filled 2020-10-04: qty 1

## 2020-10-04 MED ORDER — SODIUM CHLORIDE 0.9 % IV SOLN: 200.0000 mg | Freq: Once | INTRAVENOUS | Status: DC

## 2020-10-04 NOTE — ED Triage Notes (Signed)
Recent covid Monday , positive, seizure like activity, positive loc, no thiners, no fall

## 2020-10-04 NOTE — ED Provider Notes (Signed)
Medical screening examination/treatment/procedure(s) were conducted as a shared visit with non-physician practitioner(s) and myself.  I personally evaluated the patient during the encounter.    ----------------------------------------- 4:15 PM on 10/04/2020 -----------------------------------------  Patient resting comfortably now.  She does appear dehydrated reports he is very thirsty and has not had very good intake since being diagnosed with COVID.  She is pleasant, oriented, not in any distress at this time but appears quite dry with dry mucous membranes.  Evidence of AKI by lab testing.  I will admit for further care and management in context of COVID-19, dehydration, etc.  Procalcitonin added on  Patient understand agreeable with plan for admission.  Appears stable for admission to hospitalist service at this time   Sharyn Creamer, MD 10/04/20 1616

## 2020-10-04 NOTE — H&P (Signed)
[facility] History and Physical  April Weaver DTO:[id number] DOB: [birth year] DOA: 10/04/2020  Referring physician: Dr. Fanny Bien PCP: Lorenso Quarry, NP   Chief Complaint: possible seizure  HPI: April Weaver is a 87 y.o. female with history of hypertension, hyperlipidemia, osteoporosis, overactive bladder, anxiety, chronic right lower quadrant pain, CKD, who presents after possible seizure episode was noticed by family at home.  Patient was recently diagnosed on May 24 with COVID.  She was prescribed Levaquin, prednisone, and symptomatic medications.  On my interview patient reports that she has felt crummy over the past several days and endorses not taking very much p.o. intake.  She denies chest pain or shortness of breath, denies abdominal pain.  She is not very forthcoming with the remainder of history.  Attempted to contact daughter by phone but no answer.  Per ED providers note, there was concern from family that she had some seizure-like activity while sitting in the chair.  No loss of bowel or bladder, unclear duration, simply that daughter noticed some rhythmic shaking.  In the ED initial vital signs largely unremarkable with exception of hypoxia that responded well to 2 L nasal cannula.  BMP showed elevated creatinine of 2.15 compared to prior of 1.1 in December 2021, no other significant abnormalities.  Initial troponin mildly elevated 24, lactic acid strangely resulted as 2.3 and 6.9 at the same time.  CBC with mild anemia otherwise normal.  Chest x-ray showed findings consistent with COVID-pneumonia.  Noncontrast CT head showed no acute findings.  EKG showed sinus rhythm and was unremarkable.  She was given a 500 cc LR bolus and admitted for further management.  Review of Systems:  Pertinent positives and negative per HPI, all others reviewed and negative  Past Medical History:  Diagnosis Date  . Acid reflux 02/11/2015  . Allergic rhinitis 02/11/2015  .  Arthritis 02/11/2015  . BP (high blood pressure) 02/11/2015  . 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 HLD (hyperlipidemia) 02/11/2015  . Osteoporosis, post-menopausal 02/11/2015   Past Surgical History:  Procedure Laterality Date  . APPENDECTOMY    . CARPAL TUNNEL RELEASE    . TONSILLECTOMY    . TUBAL LIGATION     Social History:  reports that she has never smoked. She does not have any smokeless tobacco history on file. She reports that she does not drink alcohol and does not use drugs.  Allergies  Allergen Reactions  . Celecoxib Other (See Comments)    Shakiness  . Metronidazole Nausea Only  . Typhoid Vaccines Swelling  . Penicillins Rash    Family History  Problem Relation Age of Onset  . Breast cancer Cousin 39       mat cousin  . Hematuria Neg Hx   . Prostate cancer Neg Hx   . Kidney cancer Neg Hx   . Bladder Cancer Neg Hx      Prior to Admission medications   Medication Sig Start Date End Date Taking? Authorizing Provider  alendronate (FOSAMAX) 70 MG tablet Take 70 mg by mouth once a week. Pt takes med on Sunday 11/26/14  Yes [provider]  amLODipine (NORVASC) 5 MG tablet Take 5 mg by mouth daily. 01/21/15  Yes [provider]  aspirin EC 81 MG tablet Take 81 mg by mouth once.   Yes [provider]  calcium carbonate (OS-CAL - DOSED IN MG OF ELEMENTAL CALCIUM) 1250 (500 CA) MG tablet Take 1 tablet by mouth daily with breakfast.   Yes [provider]  Cholecalciferol 50 MCG (2000 UT) CAPS Take 1 capsule by mouth daily. 01/12/19  Yes [provider]  docusate calcium (SURFAK) 240 MG capsule Take 240 mg by mouth daily.   Yes [provider]  Glucosamine-Chondroitin 750-600 MG CHEW Chew by mouth.   Yes [provider]  ipratropium (ATROVENT) 0.03 % nasal spray Place 2 sprays into both nostrils 2 (two) times daily. 10/01/20  Yes [provider]  levofloxacin (LEVAQUIN) 500 MG tablet Take 1 tablet by mouth  daily. 10/01/20  Yes [provider]  lisinopril-hydrochlorothiazide (PRINZIDE,ZESTORETIC) 10-12.5 MG tablet Take 1 tablet by mouth daily. 01/21/15  Yes [provider]  lovastatin (MEVACOR) 20 MG tablet Take 20 mg by mouth at bedtime. 01/21/15  Yes [provider]  mirabegron ER (MYRBETRIQ) 50 MG TB24 tablet Take 50 mg by mouth daily.   Yes [provider]  Multiple Vitamins-Minerals (CENTRUM SILVER ADULT 50+ PO) Take 1 tablet by mouth daily.   Yes [provider]  omeprazole (PRILOSEC) 10 MG capsule Take 10 mg by mouth daily. 01/21/15  Yes [provider]  predniSONE (DELTASONE) 20 MG tablet Take 40 mg by mouth daily. 10/01/20  Yes [provider]  Simethicone 125 MG CAPS Take 1 capsule by mouth as needed.   Yes [provider]  vitamin E 400 UNIT capsule Take 400 Units by mouth daily.   Yes [provider]  conjugated estrogens (PREMARIN) vaginal cream Place 1 Applicatorful vaginally daily. Daily for 14 days then 3x per week for 3 months Patient not taking: No sig reported 02/11/15   Malen Gauze, MD  mirabegron ER (MYRBETRIQ) 50 MG TB24 tablet Take 1 tablet (50 mg total) by mouth daily. Patient not taking: Reported on 10/04/2020 03/14/15   Hildred Laser, MD   Physical Exam: Vitals:   10/04/20 1452 10/04/20 1557 10/04/20 1816  BP: 119/88 120/70 119/77  Pulse: 91 76 84  Resp: 17 15 20   Temp: 98.8 F (37.1 C)    TempSrc: Oral    SpO2: 98% 95% 95%    Wt Readings from Last 3 Encounters:  03/14/15 57.2 kg  02/11/15 56.7 kg     . General:  Appears calm and comfortable . Eyes: PERRL, normal lids, irises & conjunctiva . ENT: grossly normal hearing, lips & tongue . Neck: no masses . Cardiovascular: RRR, no m/r/g. No LE edema. 04/13/15 Respiratory: CTA bilaterally, no w/r/r. Normal respiratory effort. . Abdomen: soft, ntnd . Skin: no rash or induration seen on limited exam . Musculoskeletal: grossly normal  tone BUE/BLE . Psychiatric: grossly normal mood and affect, speech fluent and appropriate . Neurologic: grossly non-focal.          Labs on Admission:  Basic Metabolic Panel: Recent Labs  Lab 10/04/20 1455  NA 136  K 3.8  CL 103  CO2 18*  GLUCOSE 190*  BUN 63*  CREATININE 2.15*  CALCIUM 8.5*   Liver Function Tests: No results for input(s): AST, ALT, ALKPHOS, BILITOT, PROT, ALBUMIN in the last 168 hours. No results for input(s): LIPASE, AMYLASE in the last 168 hours. No results for input(s): AMMONIA in the last 168 hours. CBC: Recent Labs  Lab 10/04/20 1455  WBC 8.2  NEUTROABS 7.6  HGB 11.2*  HCT 33.2*  MCV 94.3  PLT 196   Cardiac Enzymes: No results for input(s): CKTOTAL, CKMB, CKMBINDEX, TROPONINI in the last 168 hours.  BNP (last 3 results) No results for input(s): BNP in the last 8760 hours.  ProBNP (  last 3 results) No results for input(s): PROBNP in the last 8760 hours.  CBG: No results for input(s): GLUCAP in the last 168 hours.  Radiological Exams on Admission: CT Head Wo Contrast  Result Date: 10/04/2020 CLINICAL DATA:  Seizure. EXAM: CT HEAD WITHOUT CONTRAST TECHNIQUE: Contiguous axial images were obtained from the base of the skull through the vertex without intravenous contrast. COMPARISON:  None. FINDINGS: Brain: Mild chronic ischemic white matter disease is noted. No mass effect or midline shift is noted. Ventricular size is within normal limits. There is no evidence of mass lesion, hemorrhage or acute infarction. Vascular: No hyperdense vessel or unexpected calcification. Skull: Normal. Negative for fracture or focal lesion. Sinuses/Orbits: No acute finding. Other: None. IMPRESSION: No acute intracranial abnormality seen. Electronically Signed   By: Lupita Raider M.D.   On: 10/04/2020 15:54   DG Chest Portable 1 View  Result Date: 10/04/2020 CLINICAL DATA:  COVID positive 5 days ago. EXAM: PORTABLE CHEST 1 VIEW COMPARISON:  None. FINDINGS: Patchy  and streaky bibasilar opacities, left greater than right. Normal heart size and mediastinal contours. Aortic atherosclerosis. No significant pleural effusion. No pneumothorax. Mild biapical pleuroparenchymal scarring. No pulmonary edema. No acute osseous abnormalities are seen. IMPRESSION: Patchy and streaky bibasilar opacities, left greater than right, consistent with pneumonia in the setting of COVID-19 infection. Electronically Signed   By: Narda Rutherford M.D.   On: 10/04/2020 15:48    EKG: Independently reviewed.  Sinus rhythm, unremarkable EKG  Assessment/Plan Active Problems:   GERD (gastroesophageal reflux disease)   HLD (hyperlipidemia)   Hypertension   Osteoporosis, post-menopausal   OAB (overactive bladder)   Acute hypoxemic respiratory failure due to COVID-19 [facility])   Anxiety   Chronic right lower quadrant pain   CRI (chronic renal insufficiency), stage 3 (moderate) (HCC)  April Weaver is a 87 y.o. female with history of hypertension, hyperlipidemia, osteoporosis, overactive bladder, anxiety, chronic right lower quadrant pain, CKD, who presents after possible seizure episode was noticed by family at home in setting of recent COVID diagnosis and was found to have AKI and lactic acidosis.  #Acute hypoxemic respiratory failure due to COVID #Lactic acidosis #Severe sepsis Currently requiring 2 L nasal cannula, otherwise denies any symptoms.  We will treat per protocol.  Will fluid resuscitate and trend lactic acid. -Remdesivir per pharmacy protocol - Decadron p.o. per protocol - Wean O2 as tolerated, maintain pulse ox  #AKI on CKD Most recent BMP showed creatinine of 1.1, currently 2.4, will give fluid resuscitation and reevaluate. -Status post 1.5 L bolus of fluids - LR at 100 cc/h - Hold lisinopril/hydrochlorothiazide  #Elevated troponin Very mild elevation, patient without symptoms.  Suspect elevated in setting of hypoxemia as well as AKI.  Repeat  pending.  #Known medical problems Osteoporosis-hold alendronate/supplements to reduce trips in and out of room Hypertension-continue amlodipine, aspirin.  Hold lisinopril/hydrochlorothiazide Hyperlipidemia-continue statin OAB-continue mirabegron GERD-continue PPI  Code Status: Full Code DVT Prophylaxis: Lovenos Family Communication: Attempted to call daughter, no answer Disposition Plan: Inpatient, Med-Surg   Time spent: 37 min  Venora Maples MD/MPH [facility]  Note:  This document was prepared using Conservation officer, historic buildings and may include unintentional dictation errors.

## 2020-10-04 NOTE — ED Notes (Signed)
John RN aware of assigned bed 

## 2020-10-04 NOTE — ED Provider Notes (Addendum)
[facility] Emergency Department Provider Note  ____________________________________________   Event Date/Time   First MD Initiated Contact with Patient 10/04/20 1505     (approximate)  I have reviewed the triage vital signs and the nursing notes.   HISTORY  Chief Complaint Seizures (Per ems, per family, pt has what appeared to be seizure , recent positive covid test Monday )  HPI April Weaver is a 87 y.o. female with a the below medical history, presents to the ED via EMS from home.  EMS was called out by the patient's daughter, for some seizure-like activity.  According to report, the patient was sitting in her reclining chair, when she apparently began some rhythmic shaking while in the chair.  The duration is unclear, and the patient had no post-ictal state or subsequent incontinence of bladder or bowel.  There is no reported fall, head injury, but there is report of some loss of consciousness versus the patient's lack of recall.  The patient reports being diagnosed with COVID earlier in the week. She has been treating with levofloxacin, prednisone, albuterol MDI, Tussionex, and ipratropium nasal spray. She admits to being weak and notes poor oral intake. She has been sleeping in the reclining chair this week.  She denies any interim fevers, chills, sweats, cough, congestion, shortness of breath.  Patient gives a remote history of seizure activity, related to her postpartum state some 45 years prior.    Past Medical History:  Diagnosis Date  . Acid reflux 02/11/2015  . Allergic rhinitis 02/11/2015  . Arthritis 02/11/2015  . BP (high blood pressure) 02/11/2015  . 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 HLD (hyperlipidemia) 02/11/2015  . Osteoporosis, post-menopausal 02/11/2015    Patient Active Problem List   Diagnosis Date Noted  . Acute hypoxemic respiratory failure due to COVID-19 (HCC) 10/04/2020  . Overactive bladder 03/14/2015  . Allergic  rhinitis 02/11/2015  . Arthritis 02/11/2015  . Acid reflux 02/11/2015  . HLD (hyperlipidemia) 02/11/2015  . BP (high blood pressure) 02/11/2015  . 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 Urethral caruncle 02/11/2015  . OAB (overactive bladder) 02/11/2015  . 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 APPENDECTOMY    . CARPAL TUNNEL RELEASE    . TONSILLECTOMY    . TUBAL LIGATION      Prior to Admission medications   Medication Sig Start Date End Date Taking? Authorizing Provider  alendronate (FOSAMAX) 70 MG tablet Take 70 mg by mouth once a week. Pt takes med on Sunday 11/26/14  Yes [provider]  amLODipine (NORVASC) 5 MG tablet Take 5 mg by mouth daily. 01/21/15  Yes [provider]  aspirin EC 81 MG tablet Take 81 mg by mouth once.   Yes [provider]  calcium carbonate (OS-CAL - DOSED IN MG OF ELEMENTAL CALCIUM) 1250 (500 CA) MG tablet Take 1 tablet by mouth daily with breakfast.   Yes [provider]  Cholecalciferol 50 MCG (2000 UT) CAPS Take 1 capsule by mouth daily. 01/12/19  Yes [provider]  docusate calcium (SURFAK) 240 MG capsule Take 240 mg by mouth daily.   Yes [provider]  Glucosamine-Chondroitin 750-600 MG CHEW Chew by mouth.   Yes [provider]  ipratropium (ATROVENT) 0.03 % nasal spray Place 2 sprays into both nostrils 2 (two) times daily. 10/01/20  Yes [provider]  levofloxacin (LEVAQUIN) 500 MG tablet Take 1 tablet by mouth daily. 10/01/20  Yes [provider]  lisinopril-hydrochlorothiazide (PRINZIDE,ZESTORETIC) 10-12.5 MG tablet Take 1 tablet by mouth daily. 01/21/15  Yes [provider]  lovastatin (MEVACOR) 20 MG tablet Take 20 mg by mouth at bedtime. 01/21/15  Yes [provider]  mirabegron ER (MYRBETRIQ) 50 MG TB24 tablet Take 50 mg by mouth daily.   Yes [provider]  Multiple  Vitamins-Minerals (CENTRUM SILVER ADULT 50+ PO) Take 1 tablet by mouth daily.   Yes [provider]  omeprazole (PRILOSEC) 10 MG capsule Take 10 mg by mouth daily. 01/21/15  Yes [provider]  predniSONE (DELTASONE) 20 MG tablet Take 40 mg by mouth daily. 10/01/20  Yes [provider]  Simethicone 125 MG CAPS Take 1 capsule by mouth as needed.   Yes [provider]  vitamin E 400 UNIT capsule Take 400 Units by mouth daily.   Yes [provider]  conjugated estrogens (PREMARIN) vaginal cream Place 1 Applicatorful vaginally daily. Daily for 14 days then 3x per week for 3 months Patient not taking: No sig reported 02/11/15   Malen GauzeMcKenzie, Patrick L, MD  mirabegron ER (MYRBETRIQ) 50 MG TB24 tablet Take 1 tablet (50 mg total) by mouth daily. Patient not taking: Reported on 10/04/2020 03/14/15   Hildred LaserBudzyn, Brian James, MD    Allergies Celecoxib, Metronidazole, Typhoid vaccines, and Penicillins  Family History  Problem Relation Age of Onset  . Breast cancer Cousin 6067       mat cousin  . Hematuria Neg Hx   . Prostate cancer Neg Hx   . Kidney cancer Neg Hx   . Bladder Cancer Neg Hx     Social History Social History   Tobacco Use  . Smoking status: Never Smoker  Substance Use Topics  . Alcohol use: No    Alcohol/week: 0.0 standard drinks  . Drug use: No    Review of Systems  Constitutional: No fever/chills. Reports generalized weakness Eyes: No visual changes. ENT: No sore throat. Cardiovascular: Denies chest pain. Respiratory: Denies shortness of breath. Gastrointestinal: No abdominal pain.  No nausea, no vomiting.  No diarrhea.  No constipation. Genitourinary: Negative for dysuria. Musculoskeletal: Negative for back pain. Skin: Negative for rash. Neurological: Negative for headaches, focal weakness or numbness. Reports of seizure-like activity.  ____________________________________________   PHYSICAL EXAM:  VITAL SIGNS: ED Triage Vitals  [10/04/20 1452]  Enc Vitals Group     BP 119/88     Pulse Rate 91     Resp 17     Temp 98.8 F (37.1 C)     Temp Source Oral     SpO2 98 %     Weight      Height      Head Circumference      Peak Flow      Pain Score 0     Pain Loc      Pain Edu?      Excl. in GC?     Constitutional: Alert and oriented. Well appearing and in no acute distress. Eyes: Conjunctivae are normal. PERRL. EOMI. Head: Atraumatic. Nose: No congestion/rhinnorhea. Mouth/Throat: Mucous membranes are dry.  Oropharynx non-erythematous. Neck: No stridor.   Cardiovascular: Normal rate, regular rhythm. Grossly normal heart sounds.  Good peripheral circulation. Respiratory: Normal respiratory effort.  No retractions. Lungs with rhonchi noted bilaterally Gastrointestinal: Soft and nontender. No distention. No abdominal bruits. No CVA tenderness. Musculoskeletal: No lower extremity tenderness nor edema.  No joint effusions. Neurologic:  Normal speech and language. No gross focal neurologic deficits  are appreciated. No gait instability. Skin:  Skin is warm, dry and intact. No rash noted. Psychiatric: Mood and affect are normal. Speech and behavior are normal. ____________________________________________   LABS (all labs ordered are listed, but only abnormal results are displayed)  Labs Reviewed  BASIC METABOLIC PANEL - Abnormal; Notable for the following components:      Result Value   CO2 18 (*)    Glucose, Bld 190 (*)    BUN 63 (*)    Creatinine, Ser 2.15 (*)    Calcium 8.5 (*)    GFR, Estimated 22 (*)    All other components within normal limits  CBC WITH DIFFERENTIAL/PLATELET - Abnormal; Notable for the following components:   RBC 3.52 (*)    Hemoglobin 11.2 (*)    HCT 33.2 (*)    Lymphs Abs 0.3 (*)    Abs Immature Granulocytes 0.10 (*)    All other components within normal limits  LACTIC ACID, PLASMA - Abnormal; Notable for the following components:   Lactic Acid, Venous 2.3 (*)    All other  components within normal limits  TROPONIN I (HIGH SENSITIVITY) - Abnormal; Notable for the following components:   Troponin I (High Sensitivity) 24 (*)    All other components within normal limits  CULTURE, BLOOD (SINGLE)  URINE CULTURE  LACTIC ACID, PLASMA  URINALYSIS, COMPLETE (UACMP) WITH MICROSCOPIC  PROTIME-INR  APTT  PROCALCITONIN  TROPONIN I (HIGH SENSITIVITY)   ____________________________________________  EKG   ____________________________________________  RADIOLOGY I, Lissa Hoard, personally viewed and evaluated these images (plain radiographs) as part of my medical decision making, as well as reviewing the written report by the radiologist.  ED MD interpretation:  Agree with report  Official radiology report(s): CT Head Wo Contrast  Result Date: 10/04/2020 CLINICAL DATA:  Seizure. EXAM: CT HEAD WITHOUT CONTRAST TECHNIQUE: Contiguous axial images were obtained from the base of the skull through the vertex without intravenous contrast. COMPARISON:  None. FINDINGS: Brain: Mild chronic ischemic white matter disease is noted. No mass effect or midline shift is noted. Ventricular size is within normal limits. There is no evidence of mass lesion, hemorrhage or acute infarction. Vascular: No hyperdense vessel or unexpected calcification. Skull: Normal. Negative for fracture or focal lesion. Sinuses/Orbits: No acute finding. Other: None. IMPRESSION: No acute intracranial abnormality seen. Electronically Signed   By: Lupita Raider M.D.   On: 10/04/2020 15:54   DG Chest Portable 1 View  Result Date: 10/04/2020 CLINICAL DATA:  COVID positive 5 days ago. EXAM: PORTABLE CHEST 1 VIEW COMPARISON:  None. FINDINGS: Patchy and streaky bibasilar opacities, left greater than right. Normal heart size and mediastinal contours. Aortic atherosclerosis. No significant pleural effusion. No pneumothorax. Mild biapical pleuroparenchymal scarring. No pulmonary edema. No acute osseous  abnormalities are seen. IMPRESSION: Patchy and streaky bibasilar opacities, left greater than right, consistent with pneumonia in the setting of COVID-19 infection. Electronically Signed   By: Narda Rutherford M.D.   On: 10/04/2020 15:48   ____________________________________________   PROCEDURES  Procedure(s) performed (including Critical Care):  Procedures  IVFs per sepsis protocal ____________________________________________   INITIAL IMPRESSION / ASSESSMENT AND PLAN / ED COURSE  As part of my medical decision making, I reviewed the following data within the electronic MEDICAL RECORD NUMBER Labs reviewed critical lactic, AKI, elevated trop. Old chart reviewed, Radiograph reviewed as above, Discussed with admitting physician Merian Capron, MD and Notes from prior ED visits   DDX: seizure activity, SAH, COVID, sepsis, dehydration  Patient with  ED evaluation of reports of seizure-like shaking while at home prior to arrival.  Patient recently diagnosed with COVID, and has had admitted poor oral intake since that time.  Patient is reported to have had seizure-like activity while sitting in reclining chair at home.  EMS was called, and patient was transported here for evaluation.  Evaluation of the patient by l physical exam, lab, chest x-ray and CT imaging of the head revealed no acute intracranial process.  She does have an acute kidney injury with BUN/Creat at 63/2.15, troponin elevated at 24, and lactic acid elevated at 2.3.  Chest x-ray revealed bilateral basilar consolidation consistent with COVID-pneumonia.  Patient case will be discussed with hospitalist service, the patient will be admitted for management of her pneumonia, dehydration, and AKI.  Patient is agreeable to the plan at this time. ____________________________________________   FINAL CLINICAL IMPRESSION(S) / ED DIAGNOSES  Final diagnoses:  Pneumonia due to COVID-19 virus  AKI (acute kidney injury) (HCC)  Sepsis due to  COVID-19 [facility])     ED Discharge Orders    None      *Please note:  Mariska Daffin was evaluated in Emergency Department on 10/04/2020 for the symptoms described in the history of present illness. She was evaluated in the context of the global COVID-19 pandemic, which necessitated consideration that the patient might be at risk for infection with the SARS-CoV-2 virus that causes COVID-19. Institutional protocols and algorithms that pertain to the evaluation of patients at risk for COVID-19 are in a state of rapid change based on information released by regulatory bodies including the CDC and federal and state organizations. These policies and algorithms were followed during the patient's care in the ED.  Some ED evaluations and interventions may be delayed as a result of limited staffing during and the pandemic.*   Note:  This document was prepared using Dragon voice recognition software and may include unintentional dictation errors.    Lissa Hoard, PA-C 10/04/20 1716    Lissa Hoard, PA-C 10/04/20 1727    Sharyn Creamer, MD 10/05/20 [phone]

## 2020-10-04 NOTE — Progress Notes (Signed)
Pt refused IV remdesevir . Spoke with Lawson Fiscal (pts daughter) and she does not want IV remdesevir and covid vaccine. Per Lawson Fiscal, if patients oxygen saturation decline, she wants to be called and that they did not want mechanical ventilation Also, pt is on levaquin 500 mg at home for bacterial pneumonia. Dr. Bruna Potter notified  of refusal to remdesevir and levaquin. MD did not order levaquin as PNA is due to covid and not bacterial. Pt informed.

## 2020-10-04 NOTE — Consult Note (Signed)
Remdesivir - Pharmacy Brief Note   O:  ALT: N/A CXR: Patchy and streaky bibasilar opacities, left greater than right, consistent with pneumonia in the setting of COVID-19 infection. SpO2: 94% on 2L [place]   A/P:  Remdesivir 200 mg IVPB once followed by 100 mg IVPB daily x 4 days.   Raiford Noble, PharmD Pharmacy Resident  10/04/2020 7:40 PM

## 2020-10-04 NOTE — Progress Notes (Signed)
Assisted pt to the [facility], pt is very weak and deconditioned. Pt also c/o of difficulty swallowing. Aspiration precaution observed. Meds crushed in apple sauce. Placed on continuous pulse ox. Oxygen saturation is 96% on 2lpm/[place]. Fluids offered. Call bell placed within reach. Bed alarm activated. Orientation given. Will monitor.

## 2020-10-05 ENCOUNTER — Inpatient Hospital Stay: Payer: Medicare Other

## 2020-10-05 DIAGNOSIS — G8929 Other chronic pain: Secondary | ICD-10-CM

## 2020-10-05 DIAGNOSIS — G40909 Epilepsy, unspecified, not intractable, without status epilepticus: Secondary | ICD-10-CM

## 2020-10-05 DIAGNOSIS — F419 Anxiety disorder, unspecified: Secondary | ICD-10-CM

## 2020-10-05 DIAGNOSIS — R1031 Right lower quadrant pain: Secondary | ICD-10-CM

## 2020-10-05 DIAGNOSIS — N183 Chronic kidney disease, stage 3 unspecified: Secondary | ICD-10-CM

## 2020-10-05 LAB — CBC WITH DIFFERENTIAL/PLATELET
Abs Immature Granulocytes: 0.13 10*3/uL — ABNORMAL HIGH (ref 0.00–0.07)
Basophils Absolute: 0 10*3/uL (ref 0.0–0.1)
Basophils Relative: 0 %
Eosinophils Absolute: 0 10*3/uL (ref 0.0–0.5)
Eosinophils Relative: 0 %
HCT: 29.3 % — ABNORMAL LOW (ref 36.0–46.0)
Hemoglobin: 10.2 g/dL — ABNORMAL LOW (ref 12.0–15.0)
Immature Granulocytes: 2 %
Lymphocytes Relative: 9 %
Lymphs Abs: 0.5 10*3/uL — ABNORMAL LOW (ref 0.7–4.0)
MCH: 32.9 pg (ref 26.0–34.0)
MCHC: 34.8 g/dL (ref 30.0–36.0)
MCV: 94.5 fL (ref 80.0–100.0)
Monocytes Absolute: 0.1 10*3/uL (ref 0.1–1.0)
Monocytes Relative: 1 %
Neutro Abs: 4.7 10*3/uL (ref 1.7–7.7)
Neutrophils Relative %: 88 %
Platelets: 167 10*3/uL (ref 150–400)
RBC: 3.1 MIL/uL — ABNORMAL LOW (ref 3.87–5.11)
RDW: 12.6 % (ref 11.5–15.5)
WBC: 5.5 10*3/uL (ref 4.0–10.5)
nRBC: 0 % (ref 0.0–0.2)

## 2020-10-05 LAB — COMPREHENSIVE METABOLIC PANEL
ALT: 26 U/L (ref 0–44)
AST: 41 U/L (ref 15–41)
Albumin: 2.7 g/dL — ABNORMAL LOW (ref 3.5–5.0)
Alkaline Phosphatase: 37 U/L — ABNORMAL LOW (ref 38–126)
Anion gap: 9 (ref 5–15)
BUN: 41 mg/dL — ABNORMAL HIGH (ref 8–23)
CO2: 24 mmol/L (ref 22–32)
Calcium: 8.4 mg/dL — ABNORMAL LOW (ref 8.9–10.3)
Chloride: 106 mmol/L (ref 98–111)
Creatinine, Ser: 1.4 mg/dL — ABNORMAL HIGH (ref 0.44–1.00)
GFR, Estimated: 37 mL/min — ABNORMAL LOW (ref >60)
Glucose, Bld: 143 mg/dL — ABNORMAL HIGH (ref 70–99)
Potassium: 4.1 mmol/L (ref 3.5–5.1)
Sodium: 139 mmol/L (ref 135–145)
Total Bilirubin: 0.7 mg/dL (ref 0.3–1.2)
Total Protein: 6 g/dL — ABNORMAL LOW (ref 6.5–8.1)

## 2020-10-05 LAB — D-DIMER, QUANTITATIVE: D-Dimer, Quant: 0.86 ug/mL-FEU — ABNORMAL HIGH (ref 0.00–0.50)

## 2020-10-05 LAB — FERRITIN: Ferritin: 1555 ng/mL — ABNORMAL HIGH (ref 11–307)

## 2020-10-05 LAB — C-REACTIVE PROTEIN: CRP: 22 mg/dL — ABNORMAL HIGH (ref <1.0)

## 2020-10-05 LAB — PHOSPHORUS: Phosphorus: 3.1 mg/dL (ref 2.5–4.6)

## 2020-10-05 LAB — MAGNESIUM: Magnesium: 2.3 mg/dL (ref 1.7–2.4)

## 2020-10-05 MED ORDER — SODIUM CHLORIDE 0.9 % IV SOLN
2.0000 g | INTRAVENOUS | Status: DC
  Administered 2020-10-05 – 2020-10-09 (×5): 2 g via INTRAVENOUS
  Filled 2020-10-05 (×5): qty 2

## 2020-10-05 MED ORDER — ENSURE ENLIVE PO LIQD
237.0000 mL | Freq: Two times a day (BID) | ORAL | Status: DC
  Administered 2020-10-06 – 2020-10-10 (×8): 237 mL via ORAL

## 2020-10-05 NOTE — Progress Notes (Signed)
PROGRESS NOTE    April Weaver  JHE:[id number] DOB: 09/18/1931 DOA: 10/04/2020 PCP: Lorenso Quarry, NP    Brief Narrative:  April Weaver is a 87 y.o. female with history of hypertension, hyperlipidemia, osteoporosis, overactive bladder, anxiety, chronic right lower quadrant pain, CKD, who presents after possible seizure episode was noticed by family at home.Patient was recently diagnosed on May 24 with COVID.  She was prescribed Levaquin, prednisone, and symptomatic medications.    Consultants:     Procedures:   Antimicrobials:   remdesivir    Subjective: Feeling little better this am. Daughter did not want remdesivir  Be given, d/w pt and she also refused.   Objective: Vitals:   10/04/20 2346 10/05/20 0436 10/05/20 0825 10/05/20 1237  BP: 105/61 113/66 124/65 (!) 106/59  Pulse: 66 67 69 70  Resp: 18 16 16 17   Temp: 97.8 F (36.6 C) 97.9 F (36.6 C) 98 F (36.7 C) 99 F (37.2 C)  TempSrc: Oral Oral    SpO2: 94% 96% 96% 94%  Weight:      Height:        Intake/Output Summary (Last 24 hours) at 10/05/2020 1249 Last data filed at 10/05/2020 1002 Gross per 24 hour  Intake 1527.26 ml  Output 400 ml  Net 1127.26 ml   Filed Weights   10/04/20 2024  Weight: 51.4 kg    Examination:  General exam: Appears calm and comfortable  Respiratory system: rhonchorus, no wheezing Cardiovascular system: S1 & S2 heard, RRR. No JVD, murmurs, rubs, gallops or clicks.  Gastrointestinal system: Abdomen is nondistended, soft and nontender. Normal bowel sounds heard. Central nervous system: Alert and oriented. No focal neurological deficits. Extremities: no edema Skin: warm, dry Psychiatry: Judgement and insight appear normal. Mood & affect appropriate.     Data Reviewed: I have personally reviewed following labs and imaging studies  CBC: Recent Labs  Lab 10/04/20 1455 10/05/20 0527  WBC 8.2 5.5  NEUTROABS 7.6 4.7  HGB 11.2* 10.2*  HCT 33.2* 29.3*  MCV  94.3 94.5  PLT 196 167   Basic Metabolic Panel: Recent Labs  Lab 10/04/20 1455 10/05/20 0527  NA 136 139  K 3.8 4.1  CL 103 106  CO2 18* 24  GLUCOSE 190* 143*  BUN 63* 41*  CREATININE 2.15* 1.40*  CALCIUM 8.5* 8.4*  MG  --  2.3  PHOS  --  3.1   GFR: Estimated Creatinine Clearance: 23.4 mL/min (A) (by C-G formula based on SCr of 1.4 mg/dL (H)). Liver Function Tests: Recent Labs  Lab 10/05/20 0527  AST 41  ALT 26  ALKPHOS 37*  BILITOT 0.7  PROT 6.0*  ALBUMIN 2.7*   No results for input(s): LIPASE, AMYLASE in the last 168 hours. No results for input(s): AMMONIA in the last 168 hours. Coagulation Profile: Recent Labs  Lab 10/04/20 1455  INR 1.1   Cardiac Enzymes: No results for input(s): CKTOTAL, CKMB, CKMBINDEX, TROPONINI in the last 168 hours. BNP (last 3 results) No results for input(s): PROBNP in the last 8760 hours. HbA1C: No results for input(s): HGBA1C in the last 72 hours. CBG: No results for input(s): GLUCAP in the last 168 hours. Lipid Profile: No results for input(s): CHOL, HDL, LDLCALC, TRIG, CHOLHDL, LDLDIRECT in the last 72 hours. Thyroid Function Tests: No results for input(s): TSH, T4TOTAL, FREET4, T3FREE, THYROIDAB in the last 72 hours. Anemia Panel: Recent Labs    10/05/20 0527  FERRITIN 1,555*   Sepsis Labs: Recent Labs  Lab 10/04/20 1455  10/04/20 2036 10/04/20 2245  PROCALCITON 4.58  --   --   LATICACIDVEN 6.9*  2.3* 2.4* 1.3    Recent Results (from the past 240 hour(s))  Blood culture (routine single)     Status: None (Preliminary result)   Collection Time: 10/04/20  8:36 PM   Specimen: BLOOD  Result Value Ref Range Status   Specimen Description BLOOD LEFT ANTECUBITAL  Final   Special Requests   Final    BOTTLES DRAWN AEROBIC AND ANAEROBIC Blood Culture adequate volume   Culture   Final    NO GROWTH < 12 HOURS Performed at [facility], [address] [place], [place] [address]    Report Status PENDING   Incomplete         Radiology Studies: CT Head Wo Contrast  Result Date: 10/04/2020 CLINICAL DATA:  Seizure. EXAM: CT HEAD WITHOUT CONTRAST TECHNIQUE: Contiguous axial images were obtained from the base of the skull through the vertex without intravenous contrast. COMPARISON:  None. FINDINGS: Brain: Mild chronic ischemic white matter disease is noted. No mass effect or midline shift is noted. Ventricular size is within normal limits. There is no evidence of mass lesion, hemorrhage or acute infarction. Vascular: No hyperdense vessel or unexpected calcification. Skull: Normal. Negative for fracture or focal lesion. Sinuses/Orbits: No acute finding. Other: None. IMPRESSION: No acute intracranial abnormality seen. Electronically Signed   By: Lupita Raider M.D.   On: 10/04/2020 15:54   DG Chest Portable 1 View  Result Date: 10/04/2020 CLINICAL DATA:  COVID positive 5 days ago. EXAM: PORTABLE CHEST 1 VIEW COMPARISON:  None. FINDINGS: Patchy and streaky bibasilar opacities, left greater than right. Normal heart size and mediastinal contours. Aortic atherosclerosis. No significant pleural effusion. No pneumothorax. Mild biapical pleuroparenchymal scarring. No pulmonary edema. No acute osseous abnormalities are seen. IMPRESSION: Patchy and streaky bibasilar opacities, left greater than right, consistent with pneumonia in the setting of COVID-19 infection. Electronically Signed   By: Narda Rutherford M.D.   On: 10/04/2020 15:48        Scheduled Meds: . amLODipine  5 mg Oral Daily  . dexamethasone  6 mg Oral Q24H  . enoxaparin (LOVENOX) injection  30 mg Subcutaneous Q24H  . Ipratropium-Albuterol  1 puff Inhalation Q6H  . mirabegron ER  50 mg Oral Daily  . pantoprazole  40 mg Oral Daily  . pravastatin  20 mg Oral q1800   Continuous Infusions: . ceFEPime (MAXIPIME) IV 2 g (10/05/20 1002)  . lactated ringers 100 mL/hr at 10/05/20 0445    Assessment & Plan:   Active Problems:   GERD  (gastroesophageal reflux disease)   HLD (hyperlipidemia)   Hypertension   Osteoporosis, post-menopausal   OAB (overactive bladder)   Acute hypoxemic respiratory failure due to COVID-19 [facility])   Anxiety   Chronic right lower quadrant pain   CRI (chronic renal insufficiency), stage 3 (moderate) (HCC)   April Weaver is a 87 y.o. female with history of hypertension, hyperlipidemia, osteoporosis, overactive bladder, anxiety, chronic right lower quadrant pain, CKD, who presents after possible seizure episode was noticed by family at home in setting of recent COVID diagnosis and was found to have AKI and lactic acidosis.  #Acute hypoxemic respiratory failure due to COVID Covid PNA #Lactic acidosis #Severe sepsis-due to covid pna  Requiring 2L sat 94% Continue decadron Refused Remdesivir  procalcitonin elevated 4.58, will start empiric abx with cefepime crp elevated Lactic acid improved Trend inflammatory markers    #AKI on CKD 3b  Likely prerenal Improving with ivf...2.15>>>1.40 D/c ivf  Hold ace/hctz Continue to monitor  #Elevated troponin-minimal Likely demand ischemia TP trending down  Hypertension-bp low to nml Hold amlodipine, ace/hctz for now Monitor bp    Osteoporosis-hold alendronate/supplements to reduce trips in and out of room  Hyperlipidemia-continue statin  OAB-continue mirabegron  GERD-continue PPI   DVT prophylaxis: Lovenox Code Status: Full Family Communication: Spoke to daughter  Status is: Inpatient  Remains inpatient appropriate because:Inpatient level of care appropriate due to severity of illness   Dispo: The patient is from: Home              Anticipated d/c is to: Home              Patient currently is not medically stable to d/c.   Difficult to place patient No            LOS: 1 day   Time spent: 35 minutes with more than 50% on COC    Lynn Ito, MD [facility] Pager [phone]  If 7PM-7AM, please  contact night-coverage 10/05/2020, 12:49 PM

## 2020-10-05 NOTE — Progress Notes (Signed)
Patient resting comfortably in bed with visitor at bedside, denies any unmet needs at this time.

## 2020-10-06 LAB — MAGNESIUM: Magnesium: 2 mg/dL (ref 1.7–2.4)

## 2020-10-06 LAB — COMPREHENSIVE METABOLIC PANEL
ALT: 24 U/L (ref 0–44)
AST: 39 U/L (ref 15–41)
Albumin: 2.4 g/dL — ABNORMAL LOW (ref 3.5–5.0)
Alkaline Phosphatase: 33 U/L — ABNORMAL LOW (ref 38–126)
Anion gap: 7 (ref 5–15)
BUN: 32 mg/dL — ABNORMAL HIGH (ref 8–23)
CO2: 24 mmol/L (ref 22–32)
Calcium: 8 mg/dL — ABNORMAL LOW (ref 8.9–10.3)
Chloride: 107 mmol/L (ref 98–111)
Creatinine, Ser: 1.33 mg/dL — ABNORMAL HIGH (ref 0.44–1.00)
GFR, Estimated: 39 mL/min — ABNORMAL LOW (ref >60)
Glucose, Bld: 143 mg/dL — ABNORMAL HIGH (ref 70–99)
Potassium: 4.1 mmol/L (ref 3.5–5.1)
Sodium: 138 mmol/L (ref 135–145)
Total Bilirubin: 0.6 mg/dL (ref 0.3–1.2)
Total Protein: 5.5 g/dL — ABNORMAL LOW (ref 6.5–8.1)

## 2020-10-06 LAB — CBC WITH DIFFERENTIAL/PLATELET
Abs Immature Granulocytes: 0.31 10*3/uL — ABNORMAL HIGH (ref 0.00–0.07)
Basophils Absolute: 0 10*3/uL (ref 0.0–0.1)
Basophils Relative: 0 %
Eosinophils Absolute: 0 10*3/uL (ref 0.0–0.5)
Eosinophils Relative: 0 %
HCT: 28.9 % — ABNORMAL LOW (ref 36.0–46.0)
Hemoglobin: 10.1 g/dL — ABNORMAL LOW (ref 12.0–15.0)
Immature Granulocytes: 6 %
Lymphocytes Relative: 6 %
Lymphs Abs: 0.3 10*3/uL — ABNORMAL LOW (ref 0.7–4.0)
MCH: 32.4 pg (ref 26.0–34.0)
MCHC: 34.9 g/dL (ref 30.0–36.0)
MCV: 92.6 fL (ref 80.0–100.0)
Monocytes Absolute: 0.1 10*3/uL (ref 0.1–1.0)
Monocytes Relative: 2 %
Neutro Abs: 4.4 10*3/uL (ref 1.7–7.7)
Neutrophils Relative %: 86 %
Platelets: 178 10*3/uL (ref 150–400)
RBC: 3.12 MIL/uL — ABNORMAL LOW (ref 3.87–5.11)
RDW: 12.7 % (ref 11.5–15.5)
Smear Review: NORMAL
WBC: 5.1 10*3/uL (ref 4.0–10.5)
nRBC: 0 % (ref 0.0–0.2)

## 2020-10-06 LAB — C-REACTIVE PROTEIN: CRP: 10.7 mg/dL — ABNORMAL HIGH (ref <1.0)

## 2020-10-06 LAB — PROCALCITONIN: Procalcitonin: 1.33 ng/mL

## 2020-10-06 LAB — D-DIMER, QUANTITATIVE: D-Dimer, Quant: 0.87 ug/mL-FEU — ABNORMAL HIGH (ref 0.00–0.50)

## 2020-10-06 LAB — PHOSPHORUS: Phosphorus: 2.6 mg/dL (ref 2.5–4.6)

## 2020-10-06 LAB — FERRITIN: Ferritin: 1816 ng/mL — ABNORMAL HIGH (ref 11–307)

## 2020-10-06 MED ORDER — GADOBUTROL 1 MMOL/ML IV SOLN
4.0000 mL | Freq: Once | INTRAVENOUS | Status: AC | PRN
  Administered 2020-10-06: 7.5 mL via INTRAVENOUS

## 2020-10-06 NOTE — Consult Note (Signed)
NEUROLOGY CONSULTATION NOTE   Date of service: Oct 06, 2020 Patient Name: April Weaver MRN:  [id number] DOB:  [birth year] Reason for consult: seizure-like activity _ _ _   _ __   _ __ _ _  __ __   _ __   __ _  History of Present Illness   April Weaver is a 87 y.o. 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 who presents after seizure-like episode at home.  Patient was recently diagnosed on May 24 with COVID.  She was prescribed Levaquin, prednisone, and symptomatic medications. Per family, patient developed a seizure like episode at home witnessed by daughter who is a Char[employer] fundraiser. Daughter not available at this time for additional detail on what the seizure looked like.  Patient has a history of seizures that clustered around the birth of her children decades ago and were felt to be exacerbated by hormonal fluctuations. She was on dilantin at that time, although this was weaned off yrs ago and she has not had any seizures in decades off AEDs.  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  Past History   Past Medical History:  Diagnosis Date  . Acid reflux 02/11/2015  . Allergic rhinitis 02/11/2015  . Arthritis 02/11/2015  . BP (high blood pressure) 02/11/2015  . 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 HLD (hyperlipidemia) 02/11/2015  . Osteoporosis, post-menopausal 02/11/2015   Past Surgical History:  Procedure Laterality Date  . APPENDECTOMY    . CARPAL TUNNEL RELEASE    . TONSILLECTOMY    . TUBAL LIGATION     Family History  Problem Relation Age of Onset  . Breast cancer Cousin 66       mat cousin  . Hematuria Neg Hx   . Prostate  cancer Neg Hx   . Kidney cancer Neg Hx   . Bladder Cancer Neg Hx    Social History   Socioeconomic History  . Marital status: Divorced    Spouse name: Not on file  . Number of children: Not on file  . Years of education: Not on file  . Highest education level: Not on file  Occupational History  . Not on file  Tobacco Use  . Smoking status: Never Smoker  . Smokeless tobacco: Never Used  Substance and Sexual Activity  . Alcohol use: No    Alcohol/week: 0.0 standard drinks  . Drug use: No  . Sexual activity: Not on file  Other Topics Concern  . Not on file  Social History Narrative  . Not on file   Social Determinants of Health   Financial Resource Strain: Not on file  Food Insecurity: Not on file  Transportation Needs: Not on file  Physical Activity: Not on file  Stress: Not on file  Social Connections: Not on file   Allergies  Allergen Reactions  . Celecoxib Other (See Comments)    Shakiness  . Metronidazole Nausea Only  . Typhoid Vaccines Swelling  . Penicillins Rash    Medications   Medications Prior to Admission  Medication Sig Dispense Refill Last Dose  . alendronate (FOSAMAX) 70 MG tablet Take 70 mg by mouth once a week. Pt takes med on Sunday   09/29/2020 at unk  .  amLODipine (NORVASC) 5 MG tablet Take 5 mg by mouth daily.   10/03/2020 at Unknown time  . aspirin EC 81 MG tablet Take 81 mg by mouth once.   Past Week at Unknown time  . calcium carbonate (OS-CAL - DOSED IN MG OF ELEMENTAL CALCIUM) 1250 (500 CA) MG tablet Take 1 tablet by mouth daily with breakfast.   Past Week at Unknown time  . Cholecalciferol 50 MCG (2000 UT) CAPS Take 1 capsule by mouth daily.   Past Week at Unknown time  . docusate calcium (SURFAK) 240 MG capsule Take 240 mg by mouth daily.   Past Week at Unknown time  . Glucosamine-Chondroitin 750-600 MG CHEW Chew by mouth.   Past Week at Unknown time  . ipratropium (ATROVENT) 0.03 % nasal spray Place 2 sprays into both nostrils 2 (two)  times daily.   10/04/2020 at Unknown time  . levofloxacin (LEVAQUIN) 500 MG tablet Take 1 tablet by mouth daily.   10/03/2020 at Unknown time  . lisinopril-hydrochlorothiazide (PRINZIDE,ZESTORETIC) 10-12.5 MG tablet Take 1 tablet by mouth daily.   Past Week at Unknown time  . lovastatin (MEVACOR) 20 MG tablet Take 20 mg by mouth at bedtime.   10/03/2020 at Unknown time  . mirabegron ER (MYRBETRIQ) 50 MG TB24 tablet Take 50 mg by mouth daily.   Past Week at Unknown time  . Multiple Vitamins-Minerals (CENTRUM SILVER ADULT 50+ PO) Take 1 tablet by mouth daily.   Past Week at Unknown time  . omeprazole (PRILOSEC) 10 MG capsule Take 10 mg by mouth daily.   09/29/2020 at unk  . predniSONE (DELTASONE) 20 MG tablet Take 40 mg by mouth daily.   10/03/2020 at Unknown time  . Simethicone 125 MG CAPS Take 1 capsule by mouth as needed.   prn at prn  . vitamin E 400 UNIT capsule Take 400 Units by mouth daily.   10/03/2020 at Unknown time  . conjugated estrogens (PREMARIN) vaginal cream Place 1 Applicatorful vaginally daily. Daily for 14 days then 3x per week for 3 months (Patient not taking: No sig reported) 42.5 g 12 Not Taking at Unknown time  . mirabegron ER (MYRBETRIQ) 50 MG TB24 tablet Take 1 tablet (50 mg total) by mouth daily. (Patient not taking: Reported on 10/04/2020) 30 tablet 11 Not Taking at Unknown time     Vitals   Vitals:   10/06/20 0045 10/06/20 0436 10/06/20 0704 10/06/20 0828  BP: 113/66 109/70  125/68  Pulse: 72 77  72  Resp: 16 16  16   Temp: 98.5 F (36.9 C) 98.4 F (36.9 C)  99 F (37.2 C)  TempSrc: Oral Oral  Oral  SpO2: 92% 93% 91% 90%  Weight:      Height:         Body mass index is 20.07 kg/m.  Physical Exam   Physical Exam Gen: A&O x4, NAD HEENT: Atraumatic, normocephalic;mucous membranes moist; oropharynx clear, tongue without atrophy or fasciculations. Neck: Supple, trachea midline. Resp: CTAB, no w/r/r CV: RRR, no m/g/r; nml S1 and S2. 2+ symmetric peripheral  pulses. Abd: soft/NT/ND; nabs x 4 quad Extrem: Nml bulk; no cyanosis, clubbing, or edema.  Neuro: *MS: A&O x4. Follows multi-step commands.  *Speech: fluid, nondysarthric, able to name and repeat *CN:    I: Deferred   II,III: PERRLA, VFF by confrontation, optic discs sharp   III,IV,VI: EOMI w/o nystagmus, no ptosis   V: Sensation intact from V1 to V3 to LT   VII: Eyelid closure was full.  Smile symmetric.   VIII: Hearing intact to voice   IX,X: Voice normal, palate elevates symmetrically    XI: SCM/trap 5/5 bilat   XII: Tongue protrudes midline, no atrophy or fasciculations   *Motor:   Normal bulk.  No tremor, rigidity or bradykinesia. No pronator drift.    Strength: Dlt Bic Tri WrE WrF FgS Gr HF KnF KnE PlF DoF    Left 5 5 5 5 5 5 5 5 5 5 5 5     Right 5 5 5 5 5 5 5 5 5 5 5 5     *Sensory: Intact to light touch, pinprick, temperature vibration throughout. Symmetric. Propioception intact bilat.  No double-simultaneous extinction.  *Coordination:  Finger-to-nose, heel-to-shin, rapid alternating motions were intact. *Reflexes:  2+ and symmetric throughout without clonus; toes down-going bilat *Gait: normal base, normal stride, normal turn. Negative Romberg.    Labs   CBC:  Recent Labs  Lab 10/05/20 0527 10/06/20 0430  WBC 5.5 5.1  NEUTROABS 4.7 4.4  HGB 10.2* 10.1*  HCT 29.3* 28.9*  MCV 94.5 92.6  PLT 167 178    Basic Metabolic Panel:  Lab Results  Component Value Date   NA 138 10/06/2020   K 4.1 10/06/2020   CO2 24 10/06/2020   GLUCOSE 143 (H) 10/06/2020   BUN 32 (H) 10/06/2020   CREATININE 1.33 (H) 10/06/2020   CALCIUM 8.0 (L) 10/06/2020   GFRNONAA 39 (L) 10/06/2020   Lipid Panel: No results found for: LDLCALC HgbA1c: No results found for: HGBA1C Urine Drug Screen: No results found for: LABOPIA, COCAINSCRNUR, LABBENZ, AMPHETMU, THCU, LABBARB  Alcohol Level No results found for: [facility]   Impression   87 yo woman with hx seizures well-controlled for decades  off AEDs had provoked seizure-like episode in the setting of COVID PNA on levaquin. MRI brain showing NAICP and nonfocal neurologic exam are reassuring. She has not had any events since discontinuation of levaquin (which can lower seizure threshold)  Recommendations   - Clinical event certainly could have been a seizure, in this case provoked by infection and tx with fluoroquinolone - No e/o stroke or other new intracranial pathology on MRI - No indication for EEG, she already has hx epilepsy - No indication to start AED for single provoked seizure at this time. If she were to have a second event would consider starting AED at that time. - Pt counseled not to drive for at least 6 mos after last sz per [place] law - If she has any further events she may seek referral to neurology from her outpatient PCP - No further neurologic w/u indicated at this time. Neuro to sign off, but please re-engage if additional neurologic concerns arise. ______________________________________________________________________   Thank you for the opportunity to take part in the care of this patient. If you have any further questions, please contact the neurology consultation attending.  Signed,  [facility], MD [facility] [phone]  If 7pm- 7am, please page neurology on call as listed in AMION.

## 2020-10-06 NOTE — Progress Notes (Addendum)
PROGRESS NOTE    April Weaver  IOE:[id number] DOB: 1932-02-07 DOA: 10/04/2020 PCP: Lorenso Quarry, NP    Brief Narrative:  April Weaver is a 87 y.o. female with history of hypertension, hyperlipidemia, osteoporosis, overactive bladder, anxiety, chronic right lower quadrant pain, CKD, who presents after possible seizure episode was noticed by family at home.Patient was recently diagnosed on May 24 with COVID.  She was prescribed Levaquin, prednisone, and symptomatic medications.  5/29- feels weak. No seizure while in hospital. Still on 2L 02 sat 90%  Consultants:   neurology  Procedures:   Antimicrobials:    Subjective: Feels the same. Feels weak. No worsening sob or cp.  Objective: Vitals:   10/05/20 2005 10/06/20 0045 10/06/20 0436 10/06/20 0704  BP: (!) 93/53 113/66 109/70   Pulse: 74 72 77   Resp: 18 16 16    Temp: 98.2 F (36.8 C) 98.5 F (36.9 C) 98.4 F (36.9 C)   TempSrc: Oral Oral Oral   SpO2: 91% 92% 93% 91%  Weight:      Height:        Intake/Output Summary (Last 24 hours) at 10/06/2020 0826 Last data filed at 10/06/2020 0336 Gross per 24 hour  Intake 1433.56 ml  Output 800 ml  Net 633.56 ml   Filed Weights   10/04/20 2024  Weight: 51.4 kg    Examination: Nad, comfortable Decrease bs , no wheezing. Mild rhonchi Regular s1/s2 no gallop Soft benign +bs No edema Awake and oriented. Grossly intact Mood and affect appropriate in current setting    Data Reviewed: I have personally reviewed following labs and imaging studies  CBC: Recent Labs  Lab 10/04/20 1455 10/05/20 0527 10/06/20 0430  WBC 8.2 5.5 5.1  NEUTROABS 7.6 4.7 4.4  HGB 11.2* 10.2* 10.1*  HCT 33.2* 29.3* 28.9*  MCV 94.3 94.5 92.6  PLT 196 167 178   Basic Metabolic Panel: Recent Labs  Lab 10/04/20 1455 10/05/20 0527 10/06/20 0430  NA 136 139 138  K 3.8 4.1 4.1  CL 103 106 107  CO2 18* 24 24  GLUCOSE 190* 143* 143*  BUN 63* 41* 32*  CREATININE 2.15*  1.40* 1.33*  CALCIUM 8.5* 8.4* 8.0*  MG  --  2.3 2.0  PHOS  --  3.1 2.6   GFR: Estimated Creatinine Clearance: 24.6 mL/min (A) (by C-G formula based on SCr of 1.33 mg/dL (H)). Liver Function Tests: Recent Labs  Lab 10/05/20 0527 10/06/20 0430  AST 41 39  ALT 26 24  ALKPHOS 37* 33*  BILITOT 0.7 0.6  PROT 6.0* 5.5*  ALBUMIN 2.7* 2.4*   No results for input(s): LIPASE, AMYLASE in the last 168 hours. No results for input(s): AMMONIA in the last 168 hours. Coagulation Profile: Recent Labs  Lab 10/04/20 1455  INR 1.1   Cardiac Enzymes: No results for input(s): CKTOTAL, CKMB, CKMBINDEX, TROPONINI in the last 168 hours. BNP (last 3 results) No results for input(s): PROBNP in the last 8760 hours. HbA1C: No results for input(s): HGBA1C in the last 72 hours. CBG: No results for input(s): GLUCAP in the last 168 hours. Lipid Profile: No results for input(s): CHOL, HDL, LDLCALC, TRIG, CHOLHDL, LDLDIRECT in the last 72 hours. Thyroid Function Tests: No results for input(s): TSH, T4TOTAL, FREET4, T3FREE, THYROIDAB in the last 72 hours. Anemia Panel: Recent Labs    10/05/20 0527 10/06/20 0430  FERRITIN 1,555* 1,816*   Sepsis Labs: Recent Labs  Lab 10/04/20 1455 10/04/20 2036 10/04/20 2245  PROCALCITON 4.58  --   --  LATICACIDVEN 6.9*  2.3* 2.4* 1.3    Recent Results (from the past 240 hour(s))  Blood culture (routine single)     Status: None (Preliminary result)   Collection Time: 10/04/20  8:36 PM   Specimen: BLOOD  Result Value Ref Range Status   Specimen Description BLOOD LEFT ANTECUBITAL  Final   Special Requests   Final    BOTTLES DRAWN AEROBIC AND ANAEROBIC Blood Culture adequate volume   Culture   Final    NO GROWTH 2 DAYS Performed at [facility], [address] [place], [place] [address]    Report Status PENDING  Incomplete         Radiology Studies: CT Head Wo Contrast  Result Date: 10/04/2020 CLINICAL DATA:  Seizure. EXAM: CT HEAD  WITHOUT CONTRAST TECHNIQUE: Contiguous axial images were obtained from the base of the skull through the vertex without intravenous contrast. COMPARISON:  None. FINDINGS: Brain: Mild chronic ischemic white matter disease is noted. No mass effect or midline shift is noted. Ventricular size is within normal limits. There is no evidence of mass lesion, hemorrhage or acute infarction. Vascular: No hyperdense vessel or unexpected calcification. Skull: Normal. Negative for fracture or focal lesion. Sinuses/Orbits: No acute finding. Other: None. IMPRESSION: No acute intracranial abnormality seen. Electronically Signed   By: Lupita Raider M.D.   On: 10/04/2020 15:54   MR BRAIN W WO CONTRAST  Result Date: 10/06/2020 CLINICAL DATA:  Initial evaluation for acute seizure. EXAM: MRI HEAD WITHOUT AND WITH CONTRAST TECHNIQUE: Multiplanar, multiecho pulse sequences of the brain and surrounding structures were obtained without and with intravenous contrast. CONTRAST:  7.80mL GADAVIST GADOBUTROL 1 MMOL/ML IV SOLN COMPARISON:  Prior head CT from 10/04/2020. FINDINGS: Brain: Generalized age-related cerebral atrophy. Patchy and confluent T2/FLAIR hyperintensity within the periventricular and deep white matter both cerebral hemispheres as well as the pons, most consistent with chronic small vessel ischemic disease, moderate in nature. No abnormal foci of restricted diffusion to suggest acute or subacute ischemia or changes related to seizure. Gray-white matter differentiation maintained. No encephalomalacia to suggest chronic cortical infarction. No evidence for acute or chronic intracranial hemorrhage. No mass lesion, midline shift or mass effect. No hydrocephalus or extra-axial fluid collection. Pituitary gland suprasellar region normal. Midline structures intact. No intrinsic temporal lobe abnormality. No abnormal enhancement. Vascular: Major intracranial vascular flow voids are maintained. Skull and upper cervical spine:  Craniocervical junction normal. Bone marrow signal intensity within normal limits. No scalp soft tissue abnormality. Sinuses/Orbits: Patient status post bilateral ocular lens replacement. Globes and orbital soft tissues demonstrate no acute finding. Paranasal sinuses are largely clear. No mastoid effusion. Inner ear structures grossly normal. Other: None. IMPRESSION: 1. No acute intracranial abnormality. 2. Age-related cerebral atrophy with moderate chronic microvascular ischemic disease. Electronically Signed   By: Rise Mu M.D.   On: 10/06/2020 01:58   DG Chest Portable 1 View  Result Date: 10/04/2020 CLINICAL DATA:  COVID positive 5 days ago. EXAM: PORTABLE CHEST 1 VIEW COMPARISON:  None. FINDINGS: Patchy and streaky bibasilar opacities, left greater than right. Normal heart size and mediastinal contours. Aortic atherosclerosis. No significant pleural effusion. No pneumothorax. Mild biapical pleuroparenchymal scarring. No pulmonary edema. No acute osseous abnormalities are seen. IMPRESSION: Patchy and streaky bibasilar opacities, left greater than right, consistent with pneumonia in the setting of COVID-19 infection. Electronically Signed   By: Narda Rutherford M.D.   On: 10/04/2020 15:48        Scheduled Meds: . dexamethasone  6 mg Oral  Q24H  . enoxaparin (LOVENOX) injection  30 mg Subcutaneous Q24H  . feeding supplement  237 mL Oral BID BM  . Ipratropium-Albuterol  1 puff Inhalation Q6H  . mirabegron ER  50 mg Oral Daily  . pantoprazole  40 mg Oral Daily  . pravastatin  20 mg Oral q1800   Continuous Infusions: . ceFEPime (MAXIPIME) IV Stopped (10/05/20 1032)    Assessment & Plan:   Active Problems:   GERD (gastroesophageal reflux disease)   HLD (hyperlipidemia)   Hypertension   Osteoporosis, post-menopausal   OAB (overactive bladder)   Acute hypoxemic respiratory failure due to COVID-19 [facility])   Anxiety   Chronic right lower quadrant pain   CRI (chronic renal  insufficiency), stage 3 (moderate) (HCC)   April Weaver is a 87 y.o. female with history of hypertension, hyperlipidemia, osteoporosis, overactive bladder, anxiety, chronic right lower quadrant pain, CKD, who presents after possible seizure episode was noticed by family at home in setting of recent COVID diagnosis and was found to have AKI and lactic acidosis.  #Acute hypoxemic respiratory failure due to COVID Covid PNA #Lactic acidosis #Severe sepsis-due to covid pna  Still requiring 02 2L  Wbc down crp and procalci.  trending down Continue with steroid Continue with cefepime Pt and daughter refused Remdesivir PT/OT consult  #seizure like episode-at home Has hx/o seizures a decade ago .was on Dilantin and weaned off years ago.  Neurology's input was appreciated-clinical events certainly could be seizure, in this case provoked by infection and treatment with fluoroquinolone No indication for EEG, she already has history of epilepsy No indication to start AED capitalize for single provoked seizure at this time.  If she were to have a second event we will consider starting AED at that time Patient counseled not to drive for at least 6 months after last seizure per [place] law If she has any further events she may seek referral to neurology from her outpatient PCP MRI brain was obtained for any new pathology which was negative for any acute abnormality Neurology signed off   #AKI on CKD 3b Likely prerenal Improving with ivf...2.15>>>1.40>>>1.33 5/29-encourage po intake Hold ace/hctz ivf was d/'cd   #Elevated troponin-minimal Likely demand ischemia. No cp.  Hypertension-bp low to nml Continue to hold home bp meds.    Osteoporosis-hold alendronate/supplements to reduce trips in and out of room  Hyperlipidemia-continue statin  OAB-continue mirabegron  GERD-continue PPI   DVT prophylaxis: Lovenox Code Status: Full Family Communication: updated daughter  Lawson Fiscal  Status is: Inpatient  Remains inpatient appropriate because:Inpatient level of care appropriate due to severity of illness   Dispo: The patient is from: Home              Anticipated d/c is to: Home              Patient currently is not medically stable to d/c.   Difficult to place patient No            LOS: 2 days   Time spent: 35 minutes with more than 50% on COC    Lynn Ito, MD [facility] Pager [phone]  If 7PM-7AM, please contact night-coverage 10/06/2020, 8:26 AM

## 2020-10-07 ENCOUNTER — Inpatient Hospital Stay: Payer: Medicare Other

## 2020-10-07 LAB — CBC WITH DIFFERENTIAL/PLATELET
Abs Immature Granulocytes: 0.48 10*3/uL — ABNORMAL HIGH (ref 0.00–0.07)
Basophils Absolute: 0 10*3/uL (ref 0.0–0.1)
Basophils Relative: 1 %
Eosinophils Absolute: 0 10*3/uL (ref 0.0–0.5)
Eosinophils Relative: 0 %
HCT: 32.1 % — ABNORMAL LOW (ref 36.0–46.0)
Hemoglobin: 10.9 g/dL — ABNORMAL LOW (ref 12.0–15.0)
Immature Granulocytes: 13 %
Lymphocytes Relative: 11 %
Lymphs Abs: 0.4 10*3/uL — ABNORMAL LOW (ref 0.7–4.0)
MCH: 32.1 pg (ref 26.0–34.0)
MCHC: 34 g/dL (ref 30.0–36.0)
MCV: 94.4 fL (ref 80.0–100.0)
Monocytes Absolute: 0.1 10*3/uL (ref 0.1–1.0)
Monocytes Relative: 3 %
Neutro Abs: 2.8 10*3/uL (ref 1.7–7.7)
Neutrophils Relative %: 72 %
Platelets: 173 10*3/uL (ref 150–400)
RBC: 3.4 MIL/uL — ABNORMAL LOW (ref 3.87–5.11)
RDW: 12.9 % (ref 11.5–15.5)
Smear Review: NORMAL
WBC: 3.8 10*3/uL — ABNORMAL LOW (ref 4.0–10.5)
nRBC: 0 % (ref 0.0–0.2)

## 2020-10-07 LAB — C-REACTIVE PROTEIN: CRP: 8.9 mg/dL — ABNORMAL HIGH (ref <1.0)

## 2020-10-07 LAB — COMPREHENSIVE METABOLIC PANEL
ALT: 52 U/L — ABNORMAL HIGH (ref 0–44)
AST: 79 U/L — ABNORMAL HIGH (ref 15–41)
Albumin: 2.5 g/dL — ABNORMAL LOW (ref 3.5–5.0)
Alkaline Phosphatase: 37 U/L — ABNORMAL LOW (ref 38–126)
Anion gap: 9 (ref 5–15)
BUN: 33 mg/dL — ABNORMAL HIGH (ref 8–23)
CO2: 25 mmol/L (ref 22–32)
Calcium: 8.2 mg/dL — ABNORMAL LOW (ref 8.9–10.3)
Chloride: 104 mmol/L (ref 98–111)
Creatinine, Ser: 1.28 mg/dL — ABNORMAL HIGH (ref 0.44–1.00)
GFR, Estimated: 41 mL/min — ABNORMAL LOW (ref >60)
Glucose, Bld: 185 mg/dL — ABNORMAL HIGH (ref 70–99)
Potassium: 4.6 mmol/L (ref 3.5–5.1)
Sodium: 138 mmol/L (ref 135–145)
Total Bilirubin: 0.4 mg/dL (ref 0.3–1.2)
Total Protein: 5.9 g/dL — ABNORMAL LOW (ref 6.5–8.1)

## 2020-10-07 LAB — PROCALCITONIN: Procalcitonin: 0.68 ng/mL

## 2020-10-07 LAB — FERRITIN: Ferritin: 2211 ng/mL — ABNORMAL HIGH (ref 11–307)

## 2020-10-07 LAB — BRAIN NATRIURETIC PEPTIDE: B Natriuretic Peptide: 143.6 pg/mL — ABNORMAL HIGH (ref 0.0–100.0)

## 2020-10-07 LAB — PHOSPHORUS: Phosphorus: 3.4 mg/dL (ref 2.5–4.6)

## 2020-10-07 LAB — MAGNESIUM: Magnesium: 2 mg/dL (ref 1.7–2.4)

## 2020-10-07 LAB — D-DIMER, QUANTITATIVE: D-Dimer, Quant: 1.39 ug/mL-FEU — ABNORMAL HIGH (ref 0.00–0.50)

## 2020-10-07 MED ORDER — FUROSEMIDE 10 MG/ML IJ SOLN
20.0000 mg | Freq: Once | INTRAMUSCULAR | Status: AC
  Administered 2020-10-07: 14:00:00 20 mg via INTRAVENOUS
  Filled 2020-10-07: qty 2

## 2020-10-07 NOTE — Progress Notes (Signed)
PROGRESS NOTE    April Weaver  BJY:[id number] DOB: Oct 02, 1931 DOA: 10/04/2020 PCP: Lorenso Quarry, NP    Brief Narrative:  April Weaver is a 87 y.o. female with history of hypertension, hyperlipidemia, osteoporosis, overactive bladder, anxiety, chronic right lower quadrant pain, CKD, who presents after possible seizure episode was noticed by family at home.Patient was recently diagnosed on May 24 with COVID.  She was prescribed Levaquin, prednisone, and symptomatic medications.  5/30-per desated on RA with ambulation to 85-85% with OT this am. She is starting to feel better. No seizure like activity noted here.      Consultants:   neurology  Procedures:   Antimicrobials:      Subjective: Feels a little better. Tmax 100. No cp or abd pain. Does report sob while lying in bed  Objective: Vitals:   10/06/20 1959 10/06/20 2314 10/07/20 0340 10/07/20 0800  BP: 116/61 123/66 132/73 135/85  Pulse:  62 64 66  Resp: 16 18 16 17   Temp: 100 F (37.8 C) 98.2 F (36.8 C) (!) 97.5 F (36.4 C) 98 F (36.7 C)  TempSrc: Oral  Oral   SpO2:  92% 94% 96%  Weight:      Height:        Intake/Output Summary (Last 24 hours) at 10/07/2020 0805 Last data filed at 10/06/2020 2300 Gross per 24 hour  Intake 456.57 ml  Output 400 ml  Net 56.57 ml   Filed Weights   10/04/20 2024  Weight: 51.4 kg    Examination: NAD, calm Mild rhonchi, no wheezing Regular S1-S2 no gallops Soft benign positive bowel sounds No edema Awake and alert x3, grossly intact Mood and affect appropriate in current setting                                                                                                          Data Reviewed: I have personally reviewed following labs and imaging studies  CBC: Recent Labs  Lab 10/04/20 1455 10/05/20 0527 10/06/20 0430 10/07/20 0333  WBC 8.2 5.5 5.1 3.8*  NEUTROABS 7.6 4.7 4.4 2.8  HGB 11.2* 10.2* 10.1* 10.9*  HCT 33.2* 29.3* 28.9* 32.1*   MCV 94.3 94.5 92.6 94.4  PLT 196 167 178 173   Basic Metabolic Panel: Recent Labs  Lab 10/04/20 1455 10/05/20 0527 10/06/20 0430 10/07/20 0333  NA 136 139 138 138  K 3.8 4.1 4.1 4.6  CL 103 106 107 104  CO2 18* 24 24 25   GLUCOSE 190* 143* 143* 185*  BUN 63* 41* 32* 33*  CREATININE 2.15* 1.40* 1.33* 1.28*  CALCIUM 8.5* 8.4* 8.0* 8.2*  MG  --  2.3 2.0 2.0  PHOS  --  3.1 2.6 3.4   GFR: Estimated Creatinine Clearance: 25.6 mL/min (A) (by C-G formula based on SCr of 1.28 mg/dL (H)). Liver Function Tests: Recent Labs  Lab 10/05/20 0527 10/06/20 0430 10/07/20 0333  AST 41 39 79*  ALT 26 24 52*  ALKPHOS 37* 33* 37*  BILITOT 0.7 0.6 0.4  PROT 6.0* 5.5* 5.9*  ALBUMIN 2.7*  2.4* 2.5*   No results for input(s): LIPASE, AMYLASE in the last 168 hours. No results for input(s): AMMONIA in the last 168 hours. Coagulation Profile: Recent Labs  Lab 10/04/20 1455  INR 1.1   Cardiac Enzymes: No results for input(s): CKTOTAL, CKMB, CKMBINDEX, TROPONINI in the last 168 hours. BNP (last 3 results) No results for input(s): PROBNP in the last 8760 hours. HbA1C: No results for input(s): HGBA1C in the last 72 hours. CBG: No results for input(s): GLUCAP in the last 168 hours. Lipid Profile: No results for input(s): CHOL, HDL, LDLCALC, TRIG, CHOLHDL, LDLDIRECT in the last 72 hours. Thyroid Function Tests: No results for input(s): TSH, T4TOTAL, FREET4, T3FREE, THYROIDAB in the last 72 hours. Anemia Panel: Recent Labs    10/06/20 0430 10/07/20 0333  FERRITIN 1,816* 2,211*   Sepsis Labs: Recent Labs  Lab 10/04/20 1455 10/04/20 2036 10/04/20 2245 10/06/20 0430 10/07/20 0333  PROCALCITON 4.58  --   --  1.33 0.68  LATICACIDVEN 6.9*  2.3* 2.4* 1.3  --   --     Recent Results (from the past 240 hour(s))  Urine culture     Status: Abnormal (Preliminary result)   Collection Time: 10/04/20  2:47 PM   Specimen: In/Out Cath Urine  Result Value Ref Range Status   Specimen  Description   Final    IN/OUT CATH URINE Performed at [facility], [address], [place], [place] [address]    Special Requests   Final    NONE Performed at [facility], [address] [place], [place] [address]    Culture (A)  Final    4,000 COLONIES/mL STAPHYLOCOCCUS EPIDERMIDIS 5,000 COLONIES/mL VIRIDANS STREPTOCOCCUS Standardized susceptibility testing for this organism is not available. SUSCEPTIBILITIES TO FOLLOW Performed at [facility], [address], [place], [place] [address]    Report Status PENDING  Incomplete  Blood culture (routine single)     Status: None (Preliminary result)   Collection Time: 10/04/20  8:36 PM   Specimen: BLOOD  Result Value Ref Range Status   Specimen Description BLOOD LEFT ANTECUBITAL  Final   Special Requests   Final    BOTTLES DRAWN AEROBIC AND ANAEROBIC Blood Culture adequate volume   Culture   Final    NO GROWTH 3 DAYS Performed at [facility], [address] [place], [place] [address]    Report Status PENDING  Incomplete         Radiology Studies: MR BRAIN W WO CONTRAST  Result Date: 10/06/2020 CLINICAL DATA:  Initial evaluation for acute seizure. EXAM: MRI HEAD WITHOUT AND WITH CONTRAST TECHNIQUE: Multiplanar, multiecho pulse sequences of the brain and surrounding structures were obtained without and with intravenous contrast. CONTRAST:  7.44mL GADAVIST GADOBUTROL 1 MMOL/ML IV SOLN COMPARISON:  Prior head CT from 10/04/2020. FINDINGS: Brain: Generalized age-related cerebral atrophy. Patchy and confluent T2/FLAIR hyperintensity within the periventricular and deep white matter both cerebral hemispheres as well as the pons, most consistent with chronic small vessel ischemic disease, moderate in nature. No abnormal foci of restricted diffusion to suggest acute or subacute ischemia or changes related to seizure. Gray-white matter differentiation maintained. No encephalomalacia to suggest chronic  cortical infarction. No evidence for acute or chronic intracranial hemorrhage. No mass lesion, midline shift or mass effect. No hydrocephalus or extra-axial fluid collection. Pituitary gland suprasellar region normal. Midline structures intact. No intrinsic temporal lobe abnormality. No abnormal enhancement. Vascular: Major intracranial vascular flow voids are maintained. Skull and upper cervical spine: Craniocervical  junction normal. Bone marrow signal intensity within normal limits. No scalp soft tissue abnormality. Sinuses/Orbits: Patient status post bilateral ocular lens replacement. Globes and orbital soft tissues demonstrate no acute finding. Paranasal sinuses are largely clear. No mastoid effusion. Inner ear structures grossly normal. Other: None. IMPRESSION: 1. No acute intracranial abnormality. 2. Age-related cerebral atrophy with moderate chronic microvascular ischemic disease. Electronically Signed   By: Rise MuBenjamin  McClintock M.D.   On: 10/06/2020 01:58        Scheduled Meds: . dexamethasone  6 mg Oral Q24H  . enoxaparin (LOVENOX) injection  30 mg Subcutaneous Q24H  . feeding supplement  237 mL Oral BID BM  . Ipratropium-Albuterol  1 puff Inhalation Q6H  . mirabegron ER  50 mg Oral Daily  . pantoprazole  40 mg Oral Daily  . pravastatin  20 mg Oral q1800   Continuous Infusions: . ceFEPime (MAXIPIME) IV Stopped (10/06/20 [id number])    Assessment & Plan:   Active Problems:   GERD (gastroesophageal reflux disease)   HLD (hyperlipidemia)   Hypertension   Osteoporosis, post-menopausal   OAB (overactive bladder)   Acute hypoxemic respiratory failure due to COVID-19 [facility](HCC)   Anxiety   Chronic right lower quadrant pain   CRI (chronic renal insufficiency), stage 3 (moderate) (HCC)   April Weaver is a 87 y.o. female with history of hypertension, hyperlipidemia, osteoporosis, overactive bladder, anxiety, chronic right lower quadrant pain, CKD, who presents after possible seizure  episode was noticed by family at home in setting of recent COVID diagnosis and was found to have AKI and lactic acidosis.  #Acute hypoxemic respiratory failure due to COVID Covid PNA #Lactic acidosis #Severe sepsis-due to covid pna  Will require home 02 2L ucx contaminant Wbc down crp and procal trended down Continue cefepime Continue steroid Pt and daughter Refused Remdesivir PT/OT ucx contaminent. bcx pending    #seizure like episode-at home Has hx/o seizures a decade ago .was on Dilantin and weaned off years ago.  Neurology's input was appreciated-clinical events certainly could be seizure, in this case provoked by infection and treatment with fluoroquinolone No indication for EEG, she already has history of epilepsy No indication to start AED capitalize for single provoked seizure at this time.  If she were to have a second event we will consider starting AED at that time Patient counseled not to drive for at least 6 months after last seizure per [place] law If she has any further events she may seek referral to neurology from her outpatient PCP 5/30 MRI of brain was obtained for any new pathology which was negative for any acute abnormality  No seizure activity since hospitalized  Neurology signed off    Elevated LFT- likely from covid. Not on Remdesivir Dc statin Ck RUQ US   #AKI on CKD 3b Likely prerenal Improving with ivf...2.15>>>1.40>>>1.33>>>1.28 5/30 renal function stable, slowly continues to improve Continue to hold acei/hctz ivf was discontinued   #Elevated troponin-minimal Likely demand ischemia. No cp.  Hypertension-bp low to nml Continue to hold home bp meds.    Osteoporosis-hold alendronate/supplements to reduce trips in and out of room  Hyperlipidemia-continue statin  OAB-continue mirabegron  GERD-continue PPI   DVT prophylaxis: Lovenox Code Status: Full Family Communication: updated daughter Lawson FiscalLori  Status is: Inpatient  Remains  inpatient appropriate because:Inpatient level of care appropriate due to severity of illness   Dispo: The patient is from: Home              Anticipated d/c is to: Home  Patient currently is not medically stable to d/c.   Difficult to place patient No            LOS: 3 days   Time spent: 35 minutes with more than 50% on COC    Lynn Ito, MD [facility] Pager [phone]  If 7PM-7AM, please contact night-coverage 10/07/2020, 8:05 AM

## 2020-10-07 NOTE — Care Management Important Message (Addendum)
Important Message  Patient Details  Name: April Weaver MRN: [id number] Date of Birth: [birth year]   Medicare Important Message Given:  Other (see comment)  Patient is in an isolation room so I tried reaching her by phone [phone]) but no answer.  Will try again.  Tried calling patient again at 3:37 pm but no answer.  Will try again.   Olegario Messier A [name] 10/07/2020, 2:15 PM

## 2020-10-07 NOTE — Evaluation (Signed)
Physical Therapy Evaluation Patient Details Name: Venissa Nappi MRN: [id number] DOB: 12-16-1931 Today's Date: 10/07/2020   History of Present Illness  presented to ER secondary to possible seizure activity; admitted for management of severe sepsis, acute hypoxic respiratory failure related to COVID-19.  Clinical Impression  Patient seated in recliner beginning of session; alert and oriented, follows commands and agreeable to participation with session as tolerated.  Does endorse generalized fatigue after OT session just prior.  Denies pain at this time.  Generally weak and deconditioned due to acute illness; however, no focal weakness appreciated.  Able to complete sit/stand, basic transfers and gait (60') with RW, cga/close sup.  Demonstrates reciprocal stepping pattern, mod WBing on RW; mild/mod sway throughout gait cycle, limited balance reactions evident.  Moderate fatigue after (60') distance; unable to tolerate additional activity at this time. Recommend continued use of RW for gait trials; patient voiced understanding/awareness. Desat to 84-85% on 2L supplemental O2 with gait; increased to 3L for recovery >90%. Left on 3L end of session; RN/MD informed and aware.  Will continue to titrate O2 in subsequent sessions to help determine level required to maintain sats >88% with activity. Would benefit from skilled PT to address above deficits and promote optimal return to PLOF.; Recommend transition to HHPT upon discharge from acute hospitalization.     Follow Up Recommendations Home health PT    Equipment Recommendations  Rolling walker with 5" wheels;3in1 (PT)    Recommendations for Other Services       Precautions / Restrictions Precautions Precautions: Fall Restrictions Weight Bearing Restrictions: No      Mobility  Bed Mobility               General bed mobility comments: seated in recliner beginning/end of treatment session    Transfers Overall transfer level:  Needs assistance Equipment used: Rolling walker (2 wheeled) Transfers: Sit to/from Stand Sit to Stand: Min guard         General transfer comment: cuing for hand placement to prevent pulling on RW  Ambulation/Gait Ambulation/Gait assistance: Min guard Gait Distance (Feet): 60 Feet Assistive device: Rolling walker (2 wheeled)       General Gait Details: reciprocal stepping pattern, mod WBing on RW; mild/mod sway throughout gait cycle, limited balance reactions evident.  Moderate fatigue after (60') distance; unable to tolerate additional activity at this time. Desat to 84-85% on 2L supplemental O2 with gait; increased to 3L for recovery >90%  Stairs            Wheelchair Mobility    Modified Rankin (Stroke Patients Only)       Balance Overall balance assessment: Needs assistance Sitting-balance support: No upper extremity supported;Feet supported Sitting balance-Leahy Scale: Good     Standing balance support: Bilateral upper extremity supported Standing balance-Leahy Scale: Fair                               Pertinent Vitals/Pain Pain Assessment: No/denies pain    Home Living Family/patient expects to be discharged to:: Private residence Living Arrangements: Children Available Help at Discharge: Family;Available 24 hours/day Type of Home: House Home Access: Stairs to enter   [employer] of Steps: 2 Home Layout: Two level;Bed/bath upstairs Home Equipment: None      Prior Function Level of Independence: Independent         Comments: Indep with ADLs, household and community mobilization; denies fall history; no home O2.  Hand Dominance   Dominant Hand: Right    Extremity/Trunk Assessment   Upper Extremity Assessment Upper Extremity Assessment: Overall WFL for tasks assessed (grossly 4-/5 throughout; no focal weakness appreciated)    Lower Extremity Assessment Lower Extremity Assessment: Generalized weakness (grossly  4-/5 throughout; no focal wekaness)       Communication   Communication: No difficulties  Cognition Arousal/Alertness: Awake/alert Behavior During Therapy: WFL for tasks assessed/performed Overall Cognitive Status: Within Functional Limits for tasks assessed                                        General Comments      Exercises Other Exercises Other Exercises: Verbally reviewed role of PT and progressive mobility; reviewed recommendations for RW use and safety with assist device; reviewed role of activity pacing, energy conservation and awareness of signs/symptoms of fatigue.  Patient voiced understanding and agreement of all information.   Assessment/Plan    PT Assessment Patient needs continued PT services  PT Problem List Decreased strength;Decreased activity tolerance;Decreased balance;Decreased mobility;Decreased knowledge of use of DME;Decreased safety awareness;Decreased knowledge of precautions;Cardiopulmonary status limiting activity       PT Treatment Interventions DME instruction;Therapeutic exercise;Gait training;Stair training;Functional mobility training;Therapeutic activities;Balance training;Patient/family education    PT Goals (Current goals can be found in the Care Plan section)  Acute Rehab PT Goals Patient Stated Goal: to return home PT Goal Formulation: With patient Time For Goal Achievement: 10/21/20 Potential to Achieve Goals: Good    Frequency Min 2X/week   Barriers to discharge        Co-evaluation               AM-PAC PT "6 Clicks" Mobility  Outcome Measure Help needed turning from your back to your side while in a flat bed without using bedrails?: None Help needed moving from lying on your back to sitting on the side of a flat bed without using bedrails?: None Help needed moving to and from a bed to a chair (including a wheelchair)?: A Little Help needed standing up from a chair using your arms (e.g., wheelchair or  bedside chair)?: A Little Help needed to walk in hospital room?: A Little Help needed climbing 3-5 steps with a railing? : A Little 6 Click Score: 20    End of Session Equipment Utilized During Treatment: Gait belt;Oxygen Activity Tolerance: Patient tolerated treatment well Patient left: in chair;with call bell/phone within reach Nurse Communication: Mobility status PT Visit Diagnosis: Muscle weakness (generalized) (M62.81);Difficulty in walking, not elsewhere classified (R26.2)    Time: 8889-1694 PT Time Calculation (min) (ACUTE ONLY): 20 min   Charges:   PT Evaluation $PT Eval Moderate Complexity: 1 Mod PT Treatments $Therapeutic Activity: 8-22 mins        Yarielys Beed H. Manson Passey, PT, DPT, NCS 10/07/20, 1:41 PM [phone]

## 2020-10-07 NOTE — TOC Initial Note (Signed)
Transition of Care [facility]) - Initial/Assessment Note    Patient Details  Name: April Weaver MRN: [id number] Date of Birth: [birth year]  [facility] CM/SW Contact:    Allayne Butcher, RN Phone Number: 10/07/2020, 1:53 PM  Clinical Narrative:                 Patient admitted to the hospital with COVID, currently requiring acute O2 at 2L.  Patient will need oxygen to go home with with.  Patient is from home with her daughter, patient reports she has her own little apartment inside her daughter's house.  Patient is independent at home, she has a walker at home.  Patient denies need for bedside commode.  Patient agrees to home health services and is good with [facility].  Barbara Cower with [facility] accepted referral for RN, PT, and OT.   Potential for discharge tomorrow.    Expected Discharge Plan: Home w Home Health Services Barriers to Discharge: Continued Medical Work up   Patient Goals and CMS Choice Patient states their goals for this hospitalization and ongoing recovery are:: Patient wants to get back home CMS Medicare.gov Compare Post Acute Care list provided to:: Patient Choice offered to / list presented to : Patient  Expected Discharge Plan and Services Expected Discharge Plan: Home w Home Health Services   Discharge Planning Services: CM Consult Post Acute Care Choice: Home Health Living arrangements for the past 2 months: Single Family Home                 DME Arranged: Oxygen DME Agency: [facility]       HH Arranged: RN,PT,OT HH Agency: [facility] Date HH Agency Contacted: 10/07/20 Time HH Agency Contacted: 1200 Representative spoke with at [facility] Agency: Barbara Cower  Prior Living Arrangements/Services Living arrangements for the past 2 months: Single Family Home Lives with:: Adult Children Patient language and need for interpreter reviewed:: Yes Do you feel safe going back to the place where you live?: Yes      Need for Family Participation  in Patient Care: Yes (Comment) (COVID) Care giver support system in place?: Yes (comment) (daughters) Current home services: DME (RW) Criminal Activity/Legal Involvement Pertinent to Current Situation/Hospitalization: No - Comment as needed  Activities of Daily Living Home Assistive Devices/Equipment: Blood pressure cuff,Scales ADL Screening (condition at time of admission) Patient's cognitive ability adequate to safely complete daily activities?: Yes Is the patient deaf or have difficulty hearing?: No Does the patient have difficulty seeing, even when wearing glasses/contacts?: No Does the patient have difficulty concentrating, remembering, or making decisions?: No Patient able to express need for assistance with ADLs?: Yes Does the patient have difficulty dressing or bathing?: No Independently performs ADLs?: Yes (appropriate for developmental age) Does the patient have difficulty walking or climbing stairs?: Yes Weakness of Legs: None Weakness of Arms/Hands: None  Permission Sought/Granted Permission sought to share information with : Case Manager,Family Supports,Other (comment) Permission granted to share information with : Yes, Verbal Permission Granted  Share Information with NAME: Bonita Quin and Lawson Fiscal  Permission granted to share info w AGENCY: [facility]  Permission granted to share info w Relationship: daughters     Emotional Assessment Appearance:: Appears stated age Attitude/Demeanor/Rapport: Engaged Affect (typically observed): Accepting Orientation: : Oriented to Self,Oriented to Place,Oriented to  Time,Oriented to Situation Alcohol / Substance Use: Not Applicable Psych Involvement: No (comment)  Admission diagnosis:  AKI (acute kidney injury) (HCC) [N17.9] Sepsis due to COVID-19 (HCC) [U07.1, A41.89] Acute hypoxemic respiratory failure due  to COVID-19 (HCC) [U07.1, J96.01] Pneumonia due to COVID-19 virus [U07.1, J12.82] Patient Active Problem List   Diagnosis Date Noted   . Acute hypoxemic respiratory failure due to COVID-19 (HCC) 10/04/2020  . Chronic right lower quadrant pain 04/24/2020  . Anxiety 04/25/2019  . CRI (chronic renal insufficiency), stage 3 (moderate) (HCC) 04/26/2018  . Allergic rhinitis 02/11/2015  . Arthritis 02/11/2015  . GERD (gastroesophageal reflux disease) 02/11/2015  . HLD (hyperlipidemia) 02/11/2015  . Hypertension 02/11/2015  . 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 Urethral caruncle 02/11/2015  . OAB (overactive bladder) 02/11/2015  . 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 Overactive bladder 08/21/2014   PCP:  Lorenso Quarry, NP Pharmacy:   [facility] [address] [place] - [address] [place] [address] Phone: [phone] Fax: [phone]     Social Determinants of Health (SDOH) Interventions    Readmission Risk Interventions No flowsheet data found.

## 2020-10-07 NOTE — Evaluation (Signed)
Occupational Therapy Evaluation Patient Details Name: April Weaver MRN: [id number] DOB: May 23, 1931 Today's Date: 10/07/2020    History of Present Illness presented to ER secondary to possible seizure activity; admitted for management of severe sepsis, acute hypoxic respiratory failure related to COVID-19.   Clinical Impression   Pt seen for OT evaluation this date in setting of acute hospitalization d/t COVID-19. Pt reports being completely INDEP at baseline including running her own errands and driving. Pt presents this daet with decreased fxl activity tolerance and strength impacting her ability to perform ADLs/ADL mobility. OT educates re: role of OT in acute setting, importance of OOB activity, PLB technique to improve oxygenation. OT engages pt in standing g/h tasks, but pt can only tolerate standing to compelte one task at a time. Pt requries 4 seated rest breaks to complete bathing tasks from recliner with basin. CGA while standing to perform LB bathign with RW for UE support, alternating. Pt requires CGA for fxl mobility in the room and de-sats to 85% on room air. Pt requires 2Lnc to sustain sats >89% with mobility in the room and standing self care tasks. RN notified.   On room air at rest: 90-91% On room air with activity/walking: 85-86% On 2Lnc with activity/walking: >90%    Follow Up Recommendations  Home health OT    Equipment Recommendations  3 in 1 bedside commode;Tub/shower seat    Recommendations for Other Services       Precautions / Restrictions Precautions Precautions: Fall Restrictions Weight Bearing Restrictions: No      Mobility Bed Mobility Overal bed mobility: Modified Independent             General bed mobility comments: increased time    Transfers Overall transfer level: Needs assistance Equipment used: Rolling walker (2 wheeled) Transfers: Sit to/from Stand Sit to Stand: Min guard         General transfer comment: cues for hand  placement with RW. Pt reluctant to use initially, but noted to attempt furniture cruising without AD.    Balance Overall balance assessment: Needs assistance Sitting-balance support: No upper extremity supported;Feet supported Sitting balance-Leahy Scale: Good     Standing balance support: Bilateral upper extremity supported Standing balance-Leahy Scale: Fair                             ADL either performed or assessed with clinical judgement   ADL Overall ADL's : Needs assistance/impaired                                       General ADL Comments: CGA for standing ADLs wtih RW and fxl mobility. SETUP for all seated UB/LB ADLs. Increased time, cues for EC and PLB technique.     Vision Patient Visual Report: No change from baseline       Perception     Praxis      Pertinent Vitals/Pain Pain Assessment: No/denies pain     Hand Dominance Right   Extremity/Trunk Assessment Upper Extremity Assessment Upper Extremity Assessment: Overall WFL for tasks assessed;Generalized weakness (ROM WFL, MMT grossly 4/5)   Lower Extremity Assessment Lower Extremity Assessment: Overall WFL for tasks assessed;Generalized weakness (ROM WFL, MMT grossly 4-/5)       Communication Communication Communication: No difficulties   Cognition Arousal/Alertness: Awake/alert Behavior During Therapy: WFL for tasks assessed/performed Overall Cognitive Status: Within Functional  Limits for tasks assessed                                     General Comments       Exercises Other Exercises Other Exercises: OT educates re: role of OT in acute setting, importance of OOB activity, PLB technique to improve oxygenation. OT engages pt in standing g/h tasks, but pt can only tolerate standing to compelte one task at a time. Pt requries 4 seated rest breaks to complete bathing tasks from recliner with basin. CGA while standing to perform LB bathign with RW for UE  support, alternating.   Shoulder Instructions      Home Living Family/patient expects to be discharged to:: Private residence Living Arrangements: Children (dtr) Available Help at Discharge: Family;Available 24 hours/day Type of Home: House Home Access: Stairs to enter [employer] of Steps: 2   Home Layout: Two level;Bed/bath upstairs Alternate Level Stairs-Number of Steps: full flight, single rail             Home Equipment: Walker - 2 wheels;Cane - single point   Additional Comments: does not use any equipment      Prior Functioning/Environment Level of Independence: Independent        Comments: Indep with ADLs, household and community mobilization; denies fall history; no home O2, drives and runs her own errands. Has kitchenette in her 2nd floor suite (on her daughter's home)        OT Problem List: Decreased strength;Decreased activity tolerance;Cardiopulmonary status limiting activity      OT Treatment/Interventions: Self-care/ADL training;Therapeutic activities;Therapeutic exercise;Energy conservation;Patient/family education    OT Goals(Current goals can be found in the care plan section) Acute Rehab OT Goals Patient Stated Goal: to return home OT Goal Formulation: With patient Time For Goal Achievement: 10/21/20 Potential to Achieve Goals: Good ADL Goals Pt Will Perform Grooming: with supervision;standing (sink-side with LRAD and EC strategies PRN) Pt Will Perform Lower Body Bathing: with supervision;sit to/from stand (with LRAD and AE PRN for EC) Pt Will Perform Lower Body Dressing: with supervision;sit to/from stand (with LRAD and AE PRN for EC)  OT Frequency: Min 1X/week   Barriers to D/C:            Co-evaluation              AM-PAC OT "6 Clicks" Daily Activity     Outcome Measure Help from another person eating meals?: None Help from another person taking care of personal grooming?: A Little Help from another person toileting,  which includes using toliet, bedpan, or urinal?: A Little Help from another person bathing (including washing, rinsing, drying)?: A Little Help from another person to put on and taking off regular upper body clothing?: None Help from another person to put on and taking off regular lower body clothing?: A Little 6 Click Score: 20   End of Session Equipment Utilized During Treatment: Rolling walker;Oxygen Nurse Communication: Mobility status;Other (comment) (O2 status)  Activity Tolerance: Patient tolerated treatment well Patient left: with call bell/phone within reach;in chair  OT Visit Diagnosis: Unsteadiness on feet (R26.81);Muscle weakness (generalized) (M62.81)                Time: 0867-6195 OT Time Calculation (min): 42 min Charges:  OT General Charges $OT Visit: 1 Visit OT Evaluation $OT Eval Moderate Complexity: 1 Mod OT Treatments $Self Care/Home Management : 8-22 mins $Therapeutic Activity: 8-22 mins  Jill Side  Cloteal Isaacson, MS, OTR/L ascom [phone] 10/07/20, 3:02 PM

## 2020-10-08 DIAGNOSIS — K219 Gastro-esophageal reflux disease without esophagitis: Secondary | ICD-10-CM

## 2020-10-08 DIAGNOSIS — U071 COVID-19: Secondary | ICD-10-CM | POA: Diagnosis not present

## 2020-10-08 DIAGNOSIS — J1282 Pneumonia due to coronavirus disease 2019: Secondary | ICD-10-CM | POA: Diagnosis not present

## 2020-10-08 DIAGNOSIS — R7989 Other specified abnormal findings of blood chemistry: Secondary | ICD-10-CM

## 2020-10-08 LAB — CBC WITH DIFFERENTIAL/PLATELET
Abs Immature Granulocytes: 0.46 10*3/uL — ABNORMAL HIGH (ref 0.00–0.07)
Basophils Absolute: 0 10*3/uL (ref 0.0–0.1)
Basophils Relative: 0 %
Eosinophils Absolute: 0 10*3/uL (ref 0.0–0.5)
Eosinophils Relative: 0 %
HCT: 31.6 % — ABNORMAL LOW (ref 36.0–46.0)
Hemoglobin: 10.6 g/dL — ABNORMAL LOW (ref 12.0–15.0)
Immature Granulocytes: 7 %
Lymphocytes Relative: 9 %
Lymphs Abs: 0.5 10*3/uL — ABNORMAL LOW (ref 0.7–4.0)
MCH: 31.2 pg (ref 26.0–34.0)
MCHC: 33.5 g/dL (ref 30.0–36.0)
MCV: 92.9 fL (ref 80.0–100.0)
Monocytes Absolute: 0.2 10*3/uL (ref 0.1–1.0)
Monocytes Relative: 3 %
Neutro Abs: 5.2 10*3/uL (ref 1.7–7.7)
Neutrophils Relative %: 81 %
Platelets: 177 10*3/uL (ref 150–400)
RBC: 3.4 MIL/uL — ABNORMAL LOW (ref 3.87–5.11)
RDW: 12.5 % (ref 11.5–15.5)
Smear Review: NORMAL
WBC: 6.4 10*3/uL (ref 4.0–10.5)
nRBC: 0 % (ref 0.0–0.2)

## 2020-10-08 LAB — COMPREHENSIVE METABOLIC PANEL
ALT: 77 U/L — ABNORMAL HIGH (ref 0–44)
AST: 97 U/L — ABNORMAL HIGH (ref 15–41)
Albumin: 2.5 g/dL — ABNORMAL LOW (ref 3.5–5.0)
Alkaline Phosphatase: 35 U/L — ABNORMAL LOW (ref 38–126)
Anion gap: 10 (ref 5–15)
BUN: 33 mg/dL — ABNORMAL HIGH (ref 8–23)
CO2: 24 mmol/L (ref 22–32)
Calcium: 8.4 mg/dL — ABNORMAL LOW (ref 8.9–10.3)
Chloride: 103 mmol/L (ref 98–111)
Creatinine, Ser: 1.17 mg/dL — ABNORMAL HIGH (ref 0.44–1.00)
GFR, Estimated: 45 mL/min — ABNORMAL LOW (ref >60)
Glucose, Bld: 168 mg/dL — ABNORMAL HIGH (ref 70–99)
Potassium: 4.1 mmol/L (ref 3.5–5.1)
Sodium: 137 mmol/L (ref 135–145)
Total Bilirubin: 0.5 mg/dL (ref 0.3–1.2)
Total Protein: 5.9 g/dL — ABNORMAL LOW (ref 6.5–8.1)

## 2020-10-08 LAB — URINE CULTURE: Culture: 4000 — AB

## 2020-10-08 LAB — PHOSPHORUS: Phosphorus: 3.4 mg/dL (ref 2.5–4.6)

## 2020-10-08 LAB — FERRITIN: Ferritin: 2607 ng/mL — ABNORMAL HIGH (ref 11–307)

## 2020-10-08 LAB — MAGNESIUM: Magnesium: 1.8 mg/dL (ref 1.7–2.4)

## 2020-10-08 LAB — PROCALCITONIN: Procalcitonin: 0.43 ng/mL

## 2020-10-08 LAB — C-REACTIVE PROTEIN: CRP: 5.8 mg/dL — ABNORMAL HIGH (ref <1.0)

## 2020-10-08 LAB — D-DIMER, QUANTITATIVE: D-Dimer, Quant: 1.29 ug/mL-FEU — ABNORMAL HIGH (ref 0.00–0.50)

## 2020-10-08 NOTE — Discharge Planning (Signed)
At rest - Patient SPo2 90% on RA. While ambulating in room - SPO2 83% on RA with activity. While ambulating in room - SPO2 92% on 2L [place]. Patient will need  2L with activity to be used at home.

## 2020-10-08 NOTE — Progress Notes (Signed)
PROGRESS NOTE    April Weaver  TIW:[id number] DOB: Aug 11, 1931 DOA: 10/04/2020 PCP: Lorenso Quarry, NP    Brief Narrative:  April Weaver is a 87 y.o. female with history of hypertension, hyperlipidemia, osteoporosis, overactive bladder, anxiety, chronic right lower quadrant pain, CKD, who presents after possible seizure episode was noticed by family at home.Patient was recently diagnosed on May 24 with COVID.  She was prescribed Levaquin, prednisone, and symptomatic medications.  She had no seizure activity here.  Neurology was consulted.  She does require 2 to 3 L of nasal cannula now as she ambulates in the 80s.  Her LFTs are now elevated and GI has been consulted.       Consultants:   neurology  Procedures:   Antimicrobials:      Subjective: Feels a little better.  Feels breathing is also better.  Had good urine output yesterday.  Objective: Vitals:   10/08/20 0327 10/08/20 0703 10/08/20 0751 10/08/20 1157  BP: 135/80  135/81 126/70  Pulse: 68  72 78  Resp: 16  17 17   Temp: 97.7 F (36.5 C)  98.4 F (36.9 C) 99.1 F (37.3 C)  TempSrc: Oral  Oral Oral  SpO2: 95% 92% 92% 92%  Weight:      Height:       No intake or output data in the 24 hours ending 10/08/20 1222 Filed Weights   10/04/20 2024  Weight: 51.4 kg    Examination: NAD, comfortable Clear to auscultation no wheezing Regular S1-S2 no gallops Soft benign positive bowel sounds No edema Awake and alert and oriented, grossly intact Mood and affect appropriate in current setting   Data Reviewed: I have personally reviewed following labs and imaging studies  CBC: Recent Labs  Lab 10/04/20 1455 10/05/20 0527 10/06/20 0430 10/07/20 0333 10/08/20 0408  WBC 8.2 5.5 5.1 3.8* 6.4  NEUTROABS 7.6 4.7 4.4 2.8 5.2  HGB 11.2* 10.2* 10.1* 10.9* 10.6*  HCT 33.2* 29.3* 28.9* 32.1* 31.6*  MCV 94.3 94.5 92.6 94.4 92.9  PLT 196 167 178 173 177   Basic Metabolic Panel: Recent Labs  Lab  10/04/20 1455 10/05/20 0527 10/06/20 0430 10/07/20 0333 10/08/20 0408  NA 136 139 138 138 137  K 3.8 4.1 4.1 4.6 4.1  CL 103 106 107 104 103  CO2 18* 24 24 25 24   GLUCOSE 190* 143* 143* 185* 168*  BUN 63* 41* 32* 33* 33*  CREATININE 2.15* 1.40* 1.33* 1.28* 1.17*  CALCIUM 8.5* 8.4* 8.0* 8.2* 8.4*  MG  --  2.3 2.0 2.0 1.8  PHOS  --  3.1 2.6 3.4 3.4   GFR: Estimated Creatinine Clearance: 28 mL/min (A) (by C-G formula based on SCr of 1.17 mg/dL (H)). Liver Function Tests: Recent Labs  Lab 10/05/20 0527 10/06/20 0430 10/07/20 0333 10/08/20 0408  AST 41 39 79* 97*  ALT 26 24 52* 77*  ALKPHOS 37* 33* 37* 35*  BILITOT 0.7 0.6 0.4 0.5  PROT 6.0* 5.5* 5.9* 5.9*  ALBUMIN 2.7* 2.4* 2.5* 2.5*   No results for input(s): LIPASE, AMYLASE in the last 168 hours. No results for input(s): AMMONIA in the last 168 hours. Coagulation Profile: Recent Labs  Lab 10/04/20 1455  INR 1.1   Cardiac Enzymes: No results for input(s): CKTOTAL, CKMB, CKMBINDEX, TROPONINI in the last 168 hours. BNP (last 3 results) No results for input(s): PROBNP in the last 8760 hours. HbA1C: No results for input(s): HGBA1C in the last 72 hours. CBG: No results for input(s): GLUCAP  in the last 168 hours. Lipid Profile: No results for input(s): CHOL, HDL, LDLCALC, TRIG, CHOLHDL, LDLDIRECT in the last 72 hours. Thyroid Function Tests: No results for input(s): TSH, T4TOTAL, FREET4, T3FREE, THYROIDAB in the last 72 hours. Anemia Panel: Recent Labs    10/07/20 0333 10/08/20 0408  FERRITIN 2,211* 2,607*   Sepsis Labs: Recent Labs  Lab 10/04/20 1455 10/04/20 2036 10/04/20 2245 10/06/20 0430 10/07/20 0333 10/08/20 0408  PROCALCITON 4.58  --   --  1.33 0.68 0.43  LATICACIDVEN 6.9*  2.3* 2.4* 1.3  --   --   --     Recent Results (from the past 240 hour(s))  Urine culture     Status: Abnormal   Collection Time: 10/04/20  2:47 PM   Specimen: In/Out Cath Urine  Result Value Ref Range Status   Specimen  Description   Final    IN/OUT CATH URINE Performed at [facility], [address], [place], [place] [address]    Special Requests   Final    NONE Performed at [facility], [address], [place], [place] [address]    Culture (A)  Final    4,000 COLONIES/mL STAPHYLOCOCCUS EPIDERMIDIS 5,000 COLONIES/mL VIRIDANS STREPTOCOCCUS Standardized susceptibility testing for this organism is not available. Performed at [facility], [address] [place], [place] [address]    Report Status 10/08/2020 FINAL  Final   Organism ID, Bacteria STAPHYLOCOCCUS EPIDERMIDIS (A)  Final      Susceptibility   Staphylococcus epidermidis - MIC*    CIPROFLOXACIN >=8 RESISTANT Resistant     GENTAMICIN <=0.5 SENSITIVE Sensitive     NITROFURANTOIN <=16 SENSITIVE Sensitive     OXACILLIN <=0.25 SENSITIVE Sensitive     TETRACYCLINE <=1 SENSITIVE Sensitive     VANCOMYCIN 1 SENSITIVE Sensitive     TRIMETH/SULFA <=10 SENSITIVE Sensitive     CLINDAMYCIN RESISTANT Resistant     RIFAMPIN <=0.5 SENSITIVE Sensitive     Inducible Clindamycin POSITIVE Resistant     * 4,000 COLONIES/mL STAPHYLOCOCCUS EPIDERMIDIS  Blood culture (routine single)     Status: None (Preliminary result)   Collection Time: 10/04/20  8:36 PM   Specimen: BLOOD  Result Value Ref Range Status   Specimen Description BLOOD LEFT ANTECUBITAL  Final   Special Requests   Final    BOTTLES DRAWN AEROBIC AND ANAEROBIC Blood Culture adequate volume   Culture   Final    NO GROWTH 4 DAYS Performed at [facility], [address] [place], [place] [address]    Report Status PENDING  Incomplete         Radiology Studies: US Abdomen Limited RUQ (LIVER/GB)  Result Date: 10/07/2020 CLINICAL DATA:  Elevated LFTs EXAM: ULTRASOUND ABDOMEN LIMITED RIGHT UPPER QUADRANT COMPARISON:  None. FINDINGS: Gallbladder: No gallstones or wall thickening visualized. No sonographic Murphy sign noted by sonographer. Common bile duct:  Diameter: 2.1 mm. Liver: No focal lesion identified. Within normal limits in parenchymal echogenicity. Portal vein is patent on color Doppler imaging with normal direction of blood flow towards the liver. Other: None. IMPRESSION: No acute abnormality noted. Electronically Signed   By: Alcide Clever M.D.   On: 10/07/2020 17:16        Scheduled Meds: . dexamethasone  6 mg Oral Q24H  . enoxaparin (LOVENOX) injection  30 mg Subcutaneous Q24H  . feeding supplement  237 mL Oral BID BM  . Ipratropium-Albuterol  1 puff Inhalation Q6H  . mirabegron ER  50 mg Oral Daily  .  pantoprazole  40 mg Oral Daily   Continuous Infusions: . ceFEPime (MAXIPIME) IV 2 g (10/08/20 0906)    Assessment & Plan:   Active Problems:   GERD (gastroesophageal reflux disease)   HLD (hyperlipidemia)   Hypertension   Osteoporosis, post-menopausal   OAB (overactive bladder)   Acute hypoxemic respiratory failure due to COVID-19 [facility])   Anxiety   Chronic right lower quadrant pain   CRI (chronic renal insufficiency), stage 3 (moderate) (HCC)   April Weaver is a 87 y.o. female with history of hypertension, hyperlipidemia, osteoporosis, overactive bladder, anxiety, chronic right lower quadrant pain, CKD, who presents after possible seizure episode was noticed by family at home in setting of recent COVID diagnosis and was found to have AKI and lactic acidosis.  #Acute hypoxemic respiratory failure due to COVID Covid PNA #Lactic acidosis #Severe sepsis-due to covid pna  Will require home O2 2 -3L Urine culture contaminant  Blood cultures no growth  WBC trended down  CRP and procalcitonin trended down  Continue cefepime day 4  Given lasix x1 yesterday, with good UO.  Continue steroids  Patient and daughter refused remdesivir  PT OT recommend home health  IS     #seizure like episode-at home Has hx/o seizures a decade ago .was on Dilantin and weaned off years ago.  Neurology's input was  appreciated-clinical events certainly could be seizure, in this case provoked by infection and treatment with fluoroquinolone No indication for EEG, she already has history of epilepsy No indication to start AED capitalize for single provoked seizure at this time.  If she were to have a second event we will consider starting AED at that time Patient counseled not to drive for at least 6 months after last seizure per [place] law If she has any further events she may seek referral to neurology from her outpatient PCP 5/31 MRI of brain was obtained for any new pathology which was negative for any acute abnormality  No seizure activity while hospitalized  Neurology signed off    Elevated LFT- likely from covid. Continue to trend up Not on remdesivir Statins was discontinued RUQ ultrasound negative We will consult GI  #AKI on CKD 3b Likely prerenal Improved with ivf...2.15>>>1.40>>>1.33>>>1.28>>>1.17 5/31 IV fluid was discontinued  renal function has improved and stable Continue to hold ACE inhibitor's and HCTZ    #Elevated troponin-minimal Likely demand ischemia No chest pain  Hypertension-bp low to nml Continue to hold home bp meds.    Osteoporosis-hold alendronate/supplements to reduce trips in and out of room  Hyperlipidemia-continue statin  OAB-continue mirabegron  GERD-continue PPI   DVT prophylaxis: Lovenox Code Status: Full Family Communication: Left voicemail for daughter Lawson Fiscal  Status is: Inpatient  Remains inpatient appropriate because:Inpatient level of care appropriate due to severity of illness   Dispo: The patient is from: Home              Anticipated d/c is to: Home              Patient currently is not medically stable to d/c.   Difficult to place patient No      LOS: 4 days   Time spent: 35 minutes with more than 50% on COC    Lynn Ito, MD [facility] Pager [phone]  If 7PM-7AM, please contact night-coverage 10/08/2020,  12:22 PM

## 2020-10-08 NOTE — Plan of Care (Signed)

## 2020-10-08 NOTE — Consult Note (Signed)
Cephas Darby, MD [address]  [place]  [place], [place] [address]  Main: [phone]  Fax: [phone] Pager: [phone]   Consultation  Referring Provider:     No ref. provider found Primary Care Physician:  Toni Arthurs, NP Primary Gastroenterologist: [facility]         Reason for Consultation:     Elevated LFTs  Date of Admission:  10/04/2020 Date of Consultation:  10/08/2020         HPI:   April Weaver is a 87 y.o. female with history of hypertension, hyperlipidemia, CKD who is admitted with COVID pneumonia on 5/24.  She is currently being treated for hypoxic respiratory failure.  Patient is noted to have mildly elevated LFTs, normal bilirubin.  Patient had normal LFTs on presentation, increased to AST 79, ALT 52 on 5/30 she underwent right upper quadrant ultrasound which was normal with no evidence of cirrhosis, patent portal vein, no evidence of acute cholecystitis or cholelithiasis.  When I interviewed the patient, she denied any right upper quadrant pain, nausea or vomiting.  She just reports not having good appetite since onset of COVID pneumonia.  Hepatic Function Latest Ref Rng & Units 10/08/2020 10/07/2020 10/06/2020  Total Protein 6.5 - 8.1 g/dL 5.9(L) 5.9(L) 5.5(L)  Albumin 3.5 - 5.0 g/dL 2.5(L) 2.5(L) 2.4(L)  AST 15 - 41 U/L 97(H) 79(H) 39  ALT 0 - 44 U/L 77(H) 52(H) 24  Alk Phosphatase 38 - 126 U/L 35(L) 37(L) 33(L)  Total Bilirubin 0.3 - 1.2 mg/dL 0.5 0.4 0.6    NSAIDs: None  Antiplts/Anticoagulants/Anti thrombotics: None  GI Procedures: None  Past Medical History:  Diagnosis Date  . Acid reflux 02/11/2015  . Allergic rhinitis 02/11/2015  . Arthritis 02/11/2015  . BP (high blood pressure) 02/11/2015  . 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 HLD (hyperlipidemia) 02/11/2015  . 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 APPENDECTOMY    . CARPAL TUNNEL RELEASE    .  TONSILLECTOMY    . TUBAL LIGATION      Prior to Admission medications   Medication Sig Start Date End Date Taking? Authorizing Provider  alendronate (FOSAMAX) 70 MG tablet Take 70 mg by mouth once a week. Pt takes med on Sunday 11/26/14  Yes [provider]  amLODipine (NORVASC) 5 MG tablet Take 5 mg by mouth daily. 01/21/15  Yes [provider]  aspirin EC 81 MG tablet Take 81 mg by mouth once.   Yes [provider]  calcium carbonate (OS-CAL - DOSED IN MG OF ELEMENTAL CALCIUM) 1250 (500 CA) MG tablet Take 1 tablet by mouth daily with breakfast.   Yes [provider]  Cholecalciferol 50 MCG (2000 UT) CAPS Take 1 capsule by mouth daily. 01/12/19  Yes [provider]  docusate calcium (SURFAK) 240 MG capsule Take 240 mg by mouth daily.   Yes [provider]  Glucosamine-Chondroitin 750-600 MG CHEW Chew by mouth.   Yes [provider]  ipratropium (ATROVENT) 0.03 % nasal spray Place 2 sprays into both nostrils 2 (two) times daily. 10/01/20  Yes [provider]  levofloxacin (LEVAQUIN) 500 MG tablet Take 1 tablet by mouth daily. 10/01/20  Yes [provider]  lisinopril-hydrochlorothiazide (PRINZIDE,ZESTORETIC) 10-12.5 MG tablet Take 1 tablet by mouth daily. 01/21/15  Yes [provider]  lovastatin (MEVACOR) 20 MG tablet Take 20 mg by mouth at bedtime. 01/21/15  Yes [provider]  mirabegron ER (MYRBETRIQ) 50 MG TB24 tablet Take 50 mg by mouth daily.   Yes [provider]  Multiple Vitamins-Minerals (CENTRUM SILVER ADULT 50+ PO) Take 1 tablet by mouth daily.   Yes [provider]  omeprazole (PRILOSEC) 10 MG capsule Take 10 mg by mouth daily. 01/21/15  Yes [provider]  predniSONE (DELTASONE) 20 MG tablet Take 40 mg by mouth daily. 10/01/20  Yes [provider]  Simethicone 125 MG CAPS Take 1 capsule by mouth as needed.   Yes [provider]  vitamin E 400  UNIT capsule Take 400 Units by mouth daily.   Yes [provider]  conjugated estrogens (PREMARIN) vaginal cream Place 1 Applicatorful vaginally daily. Daily for 14 days then 3x per week for 3 months Patient not taking: No sig reported 02/11/15   Cleon Gustin, MD  mirabegron ER (MYRBETRIQ) 50 MG TB24 tablet Take 1 tablet (50 mg total) by mouth daily. Patient not taking: Reported on 10/04/2020 03/14/15   Nickie Retort, MD    Current Facility-Administered Medications:  .  acetaminophen (TYLENOL) tablet 650 mg, 650 mg, Oral, Q6H PRN, Clarnce Flock, MD, 650 mg at 10/07/20 2135 .  ceFEPIme (MAXIPIME) 2 g in sodium chloride 0.9 % 100 mL IVPB, 2 g, Intravenous, Q24H, Amery, Sahar, MD, Last Rate: 200 mL/hr at 10/08/20 0906, 2 g at 10/08/20 0906 .  dexamethasone (DECADRON) tablet 6 mg, 6 mg, Oral, Q24H, Clarnce Flock, MD, 6 mg at 10/07/20 2129 .  enoxaparin (LOVENOX) injection 30 mg, 30 mg, Subcutaneous, Q24H, Clarnce Flock, MD, 30 mg at 10/07/20 2129 .  feeding supplement (ENSURE ENLIVE / ENSURE PLUS) liquid 237 mL, 237 mL, Oral, BID BM, Kurtis Bushman, Sahar, MD, 237 mL at 10/08/20 1446 .  Ipratropium-Albuterol (COMBIVENT) respimat 1 puff, 1 puff, Inhalation, Q6H, Clarnce Flock, MD, 1 puff at 10/08/20 1446 .  mirabegron ER (MYRBETRIQ) tablet 50 mg, 50 mg, Oral, Daily, Clarnce Flock, MD, 50 mg at 10/08/20 8675 .  ondansetron (ZOFRAN) tablet 4 mg, 4 mg, Oral, Q6H PRN, 4 mg at 10/05/20 1511 **OR** ondansetron (ZOFRAN) injection 4 mg, 4 mg, Intravenous, Q6H PRN, Clarnce Flock, MD .  pantoprazole (PROTONIX) EC tablet 40 mg, 40 mg, Oral, Daily, Clarnce Flock, MD, 40 mg at 10/08/20 4492 .  polyethylene glycol (MIRALAX / GLYCOLAX) packet 17 g, 17 g, Oral, Daily PRN, Clarnce Flock, MD, 17 g at 10/06/20 0100 .  traZODone (DESYREL) tablet 25 mg, 25 mg, Oral, QHS PRN, Clarnce Flock, MD  Family History  Problem Relation Age of Onset  . Breast cancer Cousin 74        mat cousin  . Hematuria Neg Hx   . Prostate cancer Neg Hx   . Kidney cancer Neg Hx   . Bladder Cancer Neg Hx      Social History   Tobacco Use  . Smoking status: Never Smoker  . Smokeless tobacco: Never Used  Substance Use Topics  . Alcohol use: No    Alcohol/week: 0.0 standard drinks  . Drug use: No    Allergies as of 10/04/2020 - Review Complete 10/04/2020  Allergen Reaction Noted  . Celecoxib Other (See Comments) 02/11/2015  . Metronidazole Nausea Only 02/11/2015  . Typhoid vaccines Swelling 02/11/2015  . Penicillins Rash 02/11/2015    Review of Systems:    All systems reviewed and negative except where noted in HPI.   Physical Exam:  Vital signs in last 24 hours:  Temp:  [97.6 F (36.4 C)-99.1 F (37.3 C)] 97.6 F (36.4 C) (05/31 1527) Pulse Rate:  [68-96] 96 (05/31 1527) Resp:  [16-18] 17 (05/31 1527) BP: (126-137)/(63-81) 131/63 (05/31 1527) SpO2:  [83 %-95 %] 88 % (05/31 1527) Last BM Date: 10/04/20 General:   Pleasant, cooperative in NAD Head:  Normocephalic and atraumatic. Eyes:   No icterus.   Conjunctiva pink. PERRLA. Ears:  Normal auditory acuity. Neck:  Supple; no masses or thyroidomegaly Lungs: Respirations even and unlabored. Lungs clear to auscultation bilaterally.   No wheezes, crackles, or rhonchi.  Heart:  Regular rate and rhythm;  Without murmur, clicks, rubs or gallops Abdomen:  Soft, nondistended, nontender. Normal bowel sounds. No appreciable masses or hepatomegaly.  No rebound or guarding.  Rectal:  Not performed. Msk:  Symmetrical without gross deformities.  Strength generalized weakness Extremities:  Without edema, cyanosis or clubbing. Neurologic:  Alert and oriented x3;  grossly normal neurologically. Skin:  Intact without significant lesions or rashes. Psych:  Alert and cooperative. Normal affect.  LAB RESULTS: CBC Latest Ref Rng & Units 10/08/2020 10/07/2020 10/06/2020  WBC 4.0 - 10.5 K/uL 6.4 3.8(L) 5.1  Hemoglobin 12.0 -  15.0 g/dL 10.6(L) 10.9(L) 10.1(L)  Hematocrit 36.0 - 46.0 % 31.6(L) 32.1(L) 28.9(L)  Platelets 150 - 400 K/uL 177 173 178    BMET BMP Latest Ref Rng & Units 10/08/2020 10/07/2020 10/06/2020  Glucose 70 - 99 mg/dL 168(H) 185(H) 143(H)  BUN 8 - 23 mg/dL 33(H) 33(H) 32(H)  Creatinine 0.44 - 1.00 mg/dL 1.17(H) 1.28(H) 1.33(H)  Sodium 135 - 145 mmol/L 137 138 138  Potassium 3.5 - 5.1 mmol/L 4.1 4.6 4.1  Chloride 98 - 111 mmol/L 103 104 107  CO2 22 - 32 mmol/L $RemoveB'24 25 24  'aibtTewI$ Calcium 8.9 - 10.3 mg/dL 8.4(L) 8.2(L) 8.0(L)    LFT Hepatic Function Latest Ref Rng & Units 10/08/2020 10/07/2020 10/06/2020  Total Protein 6.5 - 8.1 g/dL 5.9(L) 5.9(L) 5.5(L)  Albumin 3.5 - 5.0 g/dL 2.5(L) 2.5(L) 2.4(L)  AST 15 - 41 U/L 97(H) 79(H) 39  ALT 0 - 44 U/L 77(H) 52(H) 24  Alk Phosphatase 38 - 126 U/L 35(L) 37(L) 33(L)  Total Bilirubin 0.3 - 1.2 mg/dL 0.5 0.4 0.6     STUDIES: US Abdomen Limited RUQ (LIVER/GB)  Result Date: 10/07/2020 CLINICAL DATA:  Elevated LFTs EXAM: ULTRASOUND ABDOMEN LIMITED RIGHT UPPER QUADRANT COMPARISON:  None. FINDINGS: Gallbladder: No gallstones or wall thickening visualized. No sonographic Murphy sign noted by sonographer. Common bile duct: Diameter: 2.1 mm. Liver: No focal lesion identified. Within normal limits in parenchymal echogenicity. Portal vein is patent on color Doppler imaging with normal direction of blood flow towards the liver. Other: None. IMPRESSION: No acute abnormality noted. Electronically Signed   By: Inez Catalina M.D.   On: 10/07/2020 17:16      Impression / Plan:   April Weaver is a 87 y.o. female who is admitted with COVID-pneumonia, hypoxic respiratory failure, GI is consulted for mildly elevated transaminases  Elevated AST/ALT No evidence of acute cholecystitis or cholelithiasis Likely in the setting of infection or secondary to drug-induced liver injury Check viral hepatitis panel Monitor LFTs daily, expect to improve as patient recovers from  COVID-pneumonia Avoid hepatotoxic agents  Thank you for involving me in the care of this patient.      LOS: 4 days   Sherri Sear, MD  10/08/2020, 5:36 PM   Note: This dictation was prepared with Dragon dictation along with smaller phrase technology. Any transcriptional  errors that result from this process are unintentional.

## 2020-10-09 ENCOUNTER — Inpatient Hospital Stay: Payer: Medicare Other

## 2020-10-09 LAB — PHOSPHORUS: Phosphorus: 4.1 mg/dL (ref 2.5–4.6)

## 2020-10-09 LAB — CBC WITH DIFFERENTIAL/PLATELET
Abs Immature Granulocytes: 0.5 10*3/uL — ABNORMAL HIGH (ref 0.00–0.07)
Basophils Absolute: 0 10*3/uL (ref 0.0–0.1)
Basophils Relative: 0 %
Eosinophils Absolute: 0 10*3/uL (ref 0.0–0.5)
Eosinophils Relative: 0 %
HCT: 32 % — ABNORMAL LOW (ref 36.0–46.0)
Hemoglobin: 10.9 g/dL — ABNORMAL LOW (ref 12.0–15.0)
Immature Granulocytes: 6 %
Lymphocytes Relative: 6 %
Lymphs Abs: 0.5 10*3/uL — ABNORMAL LOW (ref 0.7–4.0)
MCH: 31.7 pg (ref 26.0–34.0)
MCHC: 34.1 g/dL (ref 30.0–36.0)
MCV: 93 fL (ref 80.0–100.0)
Monocytes Absolute: 0.2 10*3/uL (ref 0.1–1.0)
Monocytes Relative: 2 %
Neutro Abs: 7.4 10*3/uL (ref 1.7–7.7)
Neutrophils Relative %: 86 %
Platelets: 148 10*3/uL — ABNORMAL LOW (ref 150–400)
RBC: 3.44 MIL/uL — ABNORMAL LOW (ref 3.87–5.11)
RDW: 12.7 % (ref 11.5–15.5)
Smear Review: NORMAL
WBC: 8.6 10*3/uL (ref 4.0–10.5)
nRBC: 0 % (ref 0.0–0.2)

## 2020-10-09 LAB — CULTURE, BLOOD (SINGLE)
Culture: NO GROWTH
Special Requests: ADEQUATE

## 2020-10-09 LAB — FERRITIN: Ferritin: 2853 ng/mL — ABNORMAL HIGH (ref 11–307)

## 2020-10-09 LAB — BRAIN NATRIURETIC PEPTIDE: B Natriuretic Peptide: 56.3 pg/mL (ref 0.0–100.0)

## 2020-10-09 LAB — COMPREHENSIVE METABOLIC PANEL
ALT: 72 U/L — ABNORMAL HIGH (ref 0–44)
AST: 76 U/L — ABNORMAL HIGH (ref 15–41)
Albumin: 2.5 g/dL — ABNORMAL LOW (ref 3.5–5.0)
Alkaline Phosphatase: 36 U/L — ABNORMAL LOW (ref 38–126)
Anion gap: 8 (ref 5–15)
BUN: 33 mg/dL — ABNORMAL HIGH (ref 8–23)
CO2: 26 mmol/L (ref 22–32)
Calcium: 8.5 mg/dL — ABNORMAL LOW (ref 8.9–10.3)
Chloride: 102 mmol/L (ref 98–111)
Creatinine, Ser: 1.22 mg/dL — ABNORMAL HIGH (ref 0.44–1.00)
GFR, Estimated: 43 mL/min — ABNORMAL LOW (ref >60)
Glucose, Bld: 181 mg/dL — ABNORMAL HIGH (ref 70–99)
Potassium: 4.8 mmol/L (ref 3.5–5.1)
Sodium: 136 mmol/L (ref 135–145)
Total Bilirubin: 0.6 mg/dL (ref 0.3–1.2)
Total Protein: 6 g/dL — ABNORMAL LOW (ref 6.5–8.1)

## 2020-10-09 LAB — HEPATITIS PANEL, ACUTE
HCV Ab: NONREACTIVE
Hep A IgM: NONREACTIVE
Hep B C IgM: NONREACTIVE
Hepatitis B Surface Ag: NONREACTIVE

## 2020-10-09 LAB — C-REACTIVE PROTEIN
CRP: 5.4 mg/dL — ABNORMAL HIGH (ref <1.0)
CRP: 7 mg/dL — ABNORMAL HIGH (ref <1.0)

## 2020-10-09 LAB — D-DIMER, QUANTITATIVE: D-Dimer, Quant: 1.3 ug/mL-FEU — ABNORMAL HIGH (ref 0.00–0.50)

## 2020-10-09 LAB — MAGNESIUM: Magnesium: 1.8 mg/dL (ref 1.7–2.4)

## 2020-10-09 MED ORDER — DEXAMETHASONE 4 MG PO TABS
6.0000 mg | ORAL_TABLET | ORAL | Status: DC
  Administered 2020-10-09 – 2020-10-16 (×8): 6 mg via ORAL
  Filled 2020-10-09 (×8): qty 2

## 2020-10-09 NOTE — Progress Notes (Signed)
Occupational Therapy Treatment Patient Details Name: April Weaver MRN: [id number] DOB: 25-Mar-1932 Today's Date: 10/09/2020    History of present illness presented to ER secondary to possible seizure activity; admitted for management of severe sepsis, acute hypoxic respiratory failure related to COVID-19.   OT comments  Pt seen for OT tx this date to f/u re: safety with ADLs/ ADL mobility. Pt noted to be connected to 7Lnc via wall oxygen and satting 93-97% at rest. Pt attached to 8Lnc via tank as tank does not offer setting for 7L. Pt sats sustained with sup to sit (performed with MOD I, HOB elevated). Pt demos G static sitting balance and requires only SETUP to don slip on shoes while in EOB sitting. Pt requires CGA/SUPV and RW use for sit to stand and fxl mobility from EOB to sink-side standing to engage in g/h tasks. Pt requires SUPV/CGA for standing balance as well as MIN cues to initiate rest breaks as needed. Pt tolerates ~6 mins standing to complete one task (requires increased time 2/2 fatigue/low fxl tolerance). Pt on 8L for start of this task with O2 sats ~94-98% and OT lowered to 6L in an attempt to wean and titrate. Pt is able to sustain sats >90% while performing remainder of standing g/h tasks (~3 minutes) on the 6Lnc.  While pt was able to perform fxl mobility to sink, she expresses that standing rest does not feel sufficient and OT brings pt chair for seated rest break. Pt LEs elevated and pt re-attached to wall oxygen at 7Lnc with sats sustaining >92%. Pt does endorse some fatigue and SOB, but feels it is tolerable to pace herself for one standing task at a time. Pt left with call button and all needs in reach. Will continue to follow acutely. Continue to anticipate pt will benefit from [facility] to ensure safety in home environment as well as offer modifications/adaptations as needed for energy conservation in setting of recovering from COVID-19.   Follow Up Recommendations  Home  health OT    Equipment Recommendations  3 in 1 bedside commode;Tub/shower seat    Recommendations for Other Services      Precautions / Restrictions Precautions Precautions: Fall Restrictions Weight Bearing Restrictions: No       Mobility Bed Mobility Overal bed mobility: Modified Independent             General bed mobility comments: increased time    Transfers Overall transfer level: Needs assistance Equipment used: Rolling walker (2 wheeled) Transfers: Sit to/from Stand Sit to Stand: Min guard         General transfer comment: CGA for steadying    Balance Overall balance assessment: Needs assistance Sitting-balance support: No upper extremity supported;Feet supported Sitting balance-Leahy Scale: Good     Standing balance support: Bilateral upper extremity supported Standing balance-Leahy Scale: Fair                             ADL either performed or assessed with clinical judgement   ADL Overall ADL's : Needs assistance/impaired     Grooming: Wash/dry face;Oral care;Supervision/safety;Standing Grooming Details (indicate cue type and reason): tolerates ~6-7 mins standing, but is requiring significantly more O2 for activity today (6-8L on tank as pt is set to 7Lnc on wall and 7L not available on tank).             Lower Body Dressing: Set up;Sitting/lateral leans Lower Body Dressing Details (indicate cue type and  reason): to don slip on shoes             Functional mobility during ADLs: Supervision/safety;Min guard;Rolling walker (some slight unsteadiness, benefits from RW use. Able to amb to sink from bed, but fatigues and requires chair to be brought to her for seated rest)       Vision Patient Visual Report: No change from baseline     Perception     Praxis      Cognition Arousal/Alertness: Awake/alert Behavior During Therapy: WFL for tasks assessed/performed Overall Cognitive Status: Within Functional Limits for  tasks assessed                                          Exercises Other Exercises Other Exercises: OT engages pt in seated dressing, fxl mobiltiy with RW to sink and standign g/h tasks to improve fxl activity tolernace, but with encouragement for pt to self-initiate seated rest breaks as needed. Pt requires MIN cues.   Shoulder Instructions       General Comments      Pertinent Vitals/ Pain       Pain Assessment: No/denies pain  Home Living                                          Prior Functioning/Environment              Frequency  Min 1X/week        Progress Toward Goals  OT Goals(current goals can now be found in the care plan section)  Progress towards OT goals: Progressing toward goals  Acute Rehab OT Goals Patient Stated Goal: to return home OT Goal Formulation: With patient Time For Goal Achievement: 10/21/20 Potential to Achieve Goals: Good  Plan Discharge plan remains appropriate    Co-evaluation                 AM-PAC OT "6 Clicks" Daily Activity     Outcome Measure   Help from another person eating meals?: None Help from another person taking care of personal grooming?: A Little Help from another person toileting, which includes using toliet, bedpan, or urinal?: A Little Help from another person bathing (including washing, rinsing, drying)?: A Little Help from another person to put on and taking off regular upper body clothing?: None Help from another person to put on and taking off regular lower body clothing?: A Little 6 Click Score: 20    End of Session Equipment Utilized During Treatment: Rolling walker;Oxygen  OT Visit Diagnosis: Unsteadiness on feet (R26.81);Muscle weakness (generalized) (M62.81)   Activity Tolerance Patient tolerated treatment well   Patient Left with call bell/phone within reach;in chair   Nurse Communication          Time: 1655-3748 OT Time Calculation (min): 38  min  Charges: OT General Charges $OT Visit: 1 Visit OT Treatments $Self Care/Home Management : 8-22 mins $Therapeutic Activity: 8-22 mins  Rejeana Brock, MS, OTR/L ascom [phone] 10/09/20, 5:36 PM

## 2020-10-09 NOTE — Progress Notes (Signed)
PROGRESS NOTE    April Weaver  VEL:[id number] DOB: 19-May-1931 DOA: 10/04/2020 PCP: Lorenso Quarry, NP    Brief Narrative:  April Weaver is a 87 y.o. female with history of hypertension, hyperlipidemia, osteoporosis, overactive bladder, anxiety, chronic right lower quadrant pain, CKD, who presented to the emergency room on 5/27 after possible seizure episode was noticed by family at home.Patient was recently diagnosed on May 24 with COVID.  She was prescribed Levaquin, prednisone, and symptomatic medications.  No further seizures in the emergency room.  Admitted to the hospitalist service.  Noted to be hypoxic requiring 2 to 3 L of oxygen by nasal cannula.  LFTs also noted to be elevated, so GI consulted.  Procalcitonin also mildly elevated and patient put on cefepime.  Patient treated with steroids.  Patient and daughter have refused Remdisivir over concerns of renal failure.  Patient was showing signs of improvement with oxygenation improving, however on night of 5/31, oxygenation worsened and patient currently on 7 L high flow nasal cannula.  Throughout day of 6/1, staying on 7 L, but oxygen saturations around 90%.  Ferritin trending upward and CRP about the same.  Patient feels like breathing today is a little more rough.   Consultants:  neurology  Procedures:   Antimicrobials:   IV cefepime 5/28-present    Objective: Vitals:   10/09/20 0500 10/09/20 0730 10/09/20 1127 10/09/20 1519  BP:  121/72 122/71 131/74  Pulse:  73 84 86  Resp:  20 17 17   Temp:  97.8 F (36.6 C) 98 F (36.7 C) 99.3 F (37.4 C)  TempSrc:   Oral   SpO2: 95% 96% 93% 94%  Weight:      Height:       No intake or output data in the 24 hours ending 10/09/20 1750 Filed Weights   10/04/20 2024  Weight: 51.4 kg    Examination: General: Alert and oriented x2, no acute distress Cardiovascular: Regular rate and rhythm, S1-S2 Lungs: Decreased breath sounds bibasilar Abdomen: Soft, nontender,  nondistended, hypoactive bowel sounds Extremities: No clubbing or cyanosis or edema  Data Reviewed: I have personally reviewed following labs and imaging studies  CBC: Recent Labs  Lab 10/05/20 0527 10/06/20 0430 10/07/20 0333 10/08/20 0408 10/09/20 0418  WBC 5.5 5.1 3.8* 6.4 8.6  NEUTROABS 4.7 4.4 2.8 5.2 7.4  HGB 10.2* 10.1* 10.9* 10.6* 10.9*  HCT 29.3* 28.9* 32.1* 31.6* 32.0*  MCV 94.5 92.6 94.4 92.9 93.0  PLT 167 178 173 177 148*   Basic Metabolic Panel: Recent Labs  Lab 10/05/20 0527 10/06/20 0430 10/07/20 0333 10/08/20 0408 10/09/20 0418  NA 139 138 138 137 136  K 4.1 4.1 4.6 4.1 4.8  CL 106 107 104 103 102  CO2 24 24 25 24 26   GLUCOSE 143* 143* 185* 168* 181*  BUN 41* 32* 33* 33* 33*  CREATININE 1.40* 1.33* 1.28* 1.17* 1.22*  CALCIUM 8.4* 8.0* 8.2* 8.4* 8.5*  MG 2.3 2.0 2.0 1.8 1.8  PHOS 3.1 2.6 3.4 3.4 4.1   GFR: Estimated Creatinine Clearance: 26.9 mL/min (A) (by C-G formula based on SCr of 1.22 mg/dL (H)). Liver Function Tests: Recent Labs  Lab 10/05/20 0527 10/06/20 0430 10/07/20 0333 10/08/20 0408 10/09/20 0418  AST 41 39 79* 97* 76*  ALT 26 24 52* 77* 72*  ALKPHOS 37* 33* 37* 35* 36*  BILITOT 0.7 0.6 0.4 0.5 0.6  PROT 6.0* 5.5* 5.9* 5.9* 6.0*  ALBUMIN 2.7* 2.4* 2.5* 2.5* 2.5*   No results for  input(s): LIPASE, AMYLASE in the last 168 hours. No results for input(s): AMMONIA in the last 168 hours. Coagulation Profile: Recent Labs  Lab 10/04/20 1455  INR 1.1   Cardiac Enzymes: No results for input(s): CKTOTAL, CKMB, CKMBINDEX, TROPONINI in the last 168 hours. BNP (last 3 results) No results for input(s): PROBNP in the last 8760 hours. HbA1C: No results for input(s): HGBA1C in the last 72 hours. CBG: No results for input(s): GLUCAP in the last 168 hours. Lipid Profile: No results for input(s): CHOL, HDL, LDLCALC, TRIG, CHOLHDL, LDLDIRECT in the last 72 hours. Thyroid Function Tests: No results for input(s): TSH, T4TOTAL, FREET4,  T3FREE, THYROIDAB in the last 72 hours. Anemia Panel: Recent Labs    10/08/20 0408 10/09/20 0418  FERRITIN 2,607* 2,853*   Sepsis Labs: Recent Labs  Lab 10/04/20 1455 10/04/20 2036 10/04/20 2245 10/06/20 0430 10/07/20 0333 10/08/20 0408  PROCALCITON 4.58  --   --  1.33 0.68 0.43  LATICACIDVEN 6.9*  2.3* 2.4* 1.3  --   --   --     Recent Results (from the past 240 hour(s))  Urine culture     Status: Abnormal   Collection Time: 10/04/20  2:47 PM   Specimen: In/Out Cath Urine  Result Value Ref Range Status   Specimen Description   Final    IN/OUT CATH URINE Performed at [facility], [address] [place], [place] [address]    Special Requests   Final    NONE Performed at [facility], [address], [place], [place] [address]    Culture (A)  Final    4,000 COLONIES/mL STAPHYLOCOCCUS EPIDERMIDIS 5,000 COLONIES/mL VIRIDANS STREPTOCOCCUS Standardized susceptibility testing for this organism is not available. Performed at [facility], [address], [place], [place] [address]    Report Status 10/08/2020 FINAL  Final   Organism ID, Bacteria STAPHYLOCOCCUS EPIDERMIDIS (A)  Final      Susceptibility   Staphylococcus epidermidis - MIC*    CIPROFLOXACIN >=8 RESISTANT Resistant     GENTAMICIN <=0.5 SENSITIVE Sensitive     NITROFURANTOIN <=16 SENSITIVE Sensitive     OXACILLIN <=0.25 SENSITIVE Sensitive     TETRACYCLINE <=1 SENSITIVE Sensitive     VANCOMYCIN 1 SENSITIVE Sensitive     TRIMETH/SULFA <=10 SENSITIVE Sensitive     CLINDAMYCIN RESISTANT Resistant     RIFAMPIN <=0.5 SENSITIVE Sensitive     Inducible Clindamycin POSITIVE Resistant     * 4,000 COLONIES/mL STAPHYLOCOCCUS EPIDERMIDIS  Blood culture (routine single)     Status: None   Collection Time: 10/04/20  8:36 PM   Specimen: BLOOD  Result Value Ref Range Status   Specimen Description BLOOD LEFT ANTECUBITAL  Final   Special Requests   Final    BOTTLES DRAWN AEROBIC AND ANAEROBIC  Blood Culture adequate volume   Culture   Final    NO GROWTH 5 DAYS Performed at [facility], [address] [place], [place] [address]    Report Status 10/09/2020 FINAL  Final         Radiology Studies: No results found.      Scheduled Meds: . dexamethasone  6 mg Oral Q24H  . enoxaparin (LOVENOX) injection  30 mg Subcutaneous Q24H  . feeding supplement  237 mL Oral BID BM  . Ipratropium-Albuterol  1 puff Inhalation Q6H  . mirabegron ER  50 mg Oral Daily  . pantoprazole  40 mg Oral Daily   Continuous Infusions:   Assessment & Plan:  Active Problems:   GERD (gastroesophageal reflux disease)   HLD (hyperlipidemia)   Hypertension   Osteoporosis, post-menopausal   OAB (overactive bladder)   Acute hypoxemic respiratory failure due to COVID-19 [facility](HCC)   Anxiety   Chronic right lower quadrant pain   CRI (chronic renal insufficiency), stage 3 (moderate) (HCC)   Meriam SpragueBeverly Lilly CoveJoyce Sandstrom is a 87 y.o. female with history of hypertension, hyperlipidemia, osteoporosis, overactive bladder, anxiety, chronic right lower quadrant pain, CKD, who presents after possible seizure episode was noticed by family at home in setting of recent COVID diagnosis and was found to have AKI and lactic acidosis.  Severe sepsis, present on admission secondary to bacterial pneumonia and COVID causing acute respiratory failure with hypoxia, present on admission: Patient meets criteria for sepsis on admission given lactic acidosis, tachypnea, tachycardia.  Sepsis episode stabilized.  Breathing initially improved, but has since worsened.  Given bump in CRP and ferritin levels minimally changed from previous day, I am concerned about worsening COVID, especially in lieu of only getting steroids and not Remdisivir.  Reaffirmed with daughter that they refused Remdisivir.  Checking other causes, checking VQ scan to rule out PE, chest x-ray to rule out worse pneumonia, BNP and procalcitonin to rule out  worsening bacterial pneumonia and follow-up CRP in the morning.  Increased Decadron to every 6 hours  #seizure like episode-at home Has hx/o seizures a decade ago .was on Dilantin and weaned off years ago.  Neurology's input was appreciated-clinical events certainly could be seizure, in this case provoked by infection and treatment with fluoroquinolone No indication for EEG, she already has history of epilepsy No indication to start AED capitalize for single provoked seizure at this time.  If she were to have a second event we will consider starting AED at that time Patient counseled not to drive for at least 6 months after last seizure per [place] law If she has any further events she may seek referral to neurology from her outpatient PCP 5/31 MRI of brain was obtained for any new pathology which was negative for any acute abnormality  No seizure activity while hospitalized  Neurology signed off    Elevated LFT- likely from covid.  Recheck in the morning.  Not on Remdisivir and statin was discontinued.  Bilateral upper quadrant ultrasound negative.  GI consulted who felt that this could be likely from infection.  Viral hepatitis panel ordered.   #AKI on CKD 3b Likely prerenal.  Improved with IV fluids and creatinine down to 1.2 however GFR at 43.  Will check VQ scan to rule out PE rather than CT t   #Elevated troponin-minimal Likely demand ischemia No chest pain  Hypertension-bp low to nml Continue to hold home bp meds.  Osteoporosis-hold alendronate/supplements to reduce trips in and out of room  Hyperlipidemia-continue statin  OAB-continue mirabegron  GERD-continue PPI   DVT prophylaxis: Lovenox Code Status: Full Family Communication: Updated daughter by phone  Status is: Inpatient  Remains inpatient appropriate because:Inpatient level of care appropriate due to severity of illness   Dispo: The patient is from: Home              Anticipated d/c is to: Home               Patient currently is not medically stable to d/c.   Difficult to place patient No      LOS: 5 days   Time spent: 35 minutes with more than 50% on COC    Abia Monaco Mordecai RasmussenK Mckaylee Dimalanta,  MD [facility] Pager [phone]  If 7PM-7AM, please contact night-coverage 10/09/2020, 5:50 PM

## 2020-10-09 NOTE — TOC Progression Note (Signed)
Transition of Care [facility]) - Progression Note    Patient Details  Name: April Weaver MRN: [id number] Date of Birth: 1931-05-21  Transition of Care [facility] CM/SW Contact  Allayne Butcher, RN Phone Number: 10/09/2020, 3:26 PM  Clinical Narrative:    Patient is on HFNC at 7L- not medically stable for discharge home at this time.  Plan is for home O2 and HH services at discharge.    Expected Discharge Plan: Home w Home Health Services Barriers to Discharge: Continued Medical Work up  Expected Discharge Plan and Services Expected Discharge Plan: Home w Home Health Services   Discharge Planning Services: CM Consult Post Acute Care Choice: Home Health Living arrangements for the past 2 months: Single Family Home                 DME Arranged: Oxygen DME Agency: [employer]       HH Arranged: RN,PT,OT HH Agency: [facility] Date HH Agency Contacted: 10/07/20 Time HH Agency Contacted: 1200 Representative spoke with at [facility] Agency: Barbara Cower   Social Determinants of Health (SDOH) Interventions    Readmission Risk Interventions No flowsheet data found.

## 2020-10-10 ENCOUNTER — Inpatient Hospital Stay: Payer: Medicare Other

## 2020-10-10 LAB — COMPREHENSIVE METABOLIC PANEL
ALT: 76 U/L — ABNORMAL HIGH (ref 0–44)
AST: 88 U/L — ABNORMAL HIGH (ref 15–41)
Albumin: 2.5 g/dL — ABNORMAL LOW (ref 3.5–5.0)
Alkaline Phosphatase: 37 U/L — ABNORMAL LOW (ref 38–126)
Anion gap: 11 (ref 5–15)
BUN: 45 mg/dL — ABNORMAL HIGH (ref 8–23)
CO2: 22 mmol/L (ref 22–32)
Calcium: 8.5 mg/dL — ABNORMAL LOW (ref 8.9–10.3)
Chloride: 104 mmol/L (ref 98–111)
Creatinine, Ser: 1.24 mg/dL — ABNORMAL HIGH (ref 0.44–1.00)
GFR, Estimated: 42 mL/min — ABNORMAL LOW (ref >60)
Glucose, Bld: 162 mg/dL — ABNORMAL HIGH (ref 70–99)
Potassium: 4.1 mmol/L (ref 3.5–5.1)
Sodium: 137 mmol/L (ref 135–145)
Total Bilirubin: 0.8 mg/dL (ref 0.3–1.2)
Total Protein: 6.1 g/dL — ABNORMAL LOW (ref 6.5–8.1)

## 2020-10-10 LAB — PROCALCITONIN: Procalcitonin: 0.59 ng/mL

## 2020-10-10 MED ORDER — SODIUM CHLORIDE 0.9 % IV SOLN
2.0000 g | INTRAVENOUS | Status: DC
  Administered 2020-10-10 – 2020-10-12 (×3): 2 g via INTRAVENOUS
  Filled 2020-10-10 (×3): qty 2

## 2020-10-10 MED ORDER — VANCOMYCIN HCL 1000 MG/200ML IV SOLN
1000.0000 mg | Freq: Once | INTRAVENOUS | Status: AC
  Administered 2020-10-10: 1000 mg via INTRAVENOUS
  Filled 2020-10-10: qty 200

## 2020-10-10 MED ORDER — VANCOMYCIN HCL 500 MG/100ML IV SOLN
500.0000 mg | INTRAVENOUS | Status: DC
  Administered 2020-10-11 – 2020-10-12 (×2): 500 mg via INTRAVENOUS
  Filled 2020-10-10 (×2): qty 100

## 2020-10-10 MED ORDER — LEVALBUTEROL TARTRATE 45 MCG/ACT IN AERO
1.0000 | INHALATION_SPRAY | Freq: Four times a day (QID) | RESPIRATORY_TRACT | Status: DC | PRN
  Filled 2020-10-10: qty 15

## 2020-10-10 MED ORDER — TECHNETIUM TO 99M ALBUMIN AGGREGATED: 4.0000 | Freq: Once | INTRAVENOUS | Status: DC | PRN

## 2020-10-10 NOTE — Plan of Care (Addendum)
Discussed with patient in front of daughter plan of care for the evening, pain management and inhaler treatments with some teach back displayed.  Explained the importance of using her incentive spirometry.  Stated to patient I would be in the room more than there NT tonight and re-oriented to use of call bell as well.   Problem: Education: Goal: Knowledge of General Education information will improve Description: Including pain rating scale, medication(s)/side effects and non-pharmacologic comfort measures Outcome: Progressing

## 2020-10-10 NOTE — Progress Notes (Signed)
PT Cancellation Note  Patient Details Name: April Weaver MRN: [id number] DOB: 11/20/1931   Cancelled Treatment:     Pt r/o for PE this am, spoke with nursing prior to entering who stated pt could participate, however pt declined stating she felt weaker today then previous day. Will try back next availability.    Jannet Askew 10/10/2020, 12:46 PM

## 2020-10-10 NOTE — Progress Notes (Signed)
PROGRESS NOTE    April Weaver  ZOX:[id number] DOB: [birth year] DOA: 10/04/2020 PCP: April QuarryLeach, Shannon, NP    Brief Narrative:  April Weaver is a 87 y.o. female with history of hypertension, hyperlipidemia, osteoporosis, overactive bladder, anxiety, chronic right lower quadrant pain, CKD, who presented to the emergency room on 5/27 after possible seizure episode was noticed by family at home.Patient was recently diagnosed on May 24 with COVID.  She was prescribed Levaquin, prednisone, and symptomatic medications.  No further seizures in the emergency room.  Admitted to the hospitalist service.  Noted to be hypoxic requiring 2 to 3 L of oxygen by nasal cannula.  LFTs also noted to be elevated, so GI consulted.  Procalcitonin also mildly elevated and patient put on cefepime.  Patient treated with steroids.  Patient and daughter have refused Remdisivir over concerns of renal failure.  Patient was showing signs of improvement with oxygenation improving, however on night of 5/31, oxygenation worsened and patient currently on 7 L high flow nasal cannula.  V/Q ruled out PE.  Procalcitonin minimally elevated with CRP which previously had been trending downward starting to come back up.  Vancomycin and cefepime added.  By 6/2, patient requiring 10 L high flow nasal cannula.  She feels somewhat winded, denies any pain  Consultants:  neurology  Procedures:  VQ scan 6/2: Low probability of pulmonary embolus Antimicrobials:   IV cefepime 6/2-present  IV vancomycin 6/2-present    Objective: Vitals:   10/10/20 0159 10/10/20 0406 10/10/20 0758 10/10/20 1137  BP: 111/67 108/68 122/65 98/63  Pulse: 89 72 67 83  Resp: 18 18 17 17   Temp: 98.2 F (36.8 C) 97.8 F (36.6 C) 97.6 F (36.4 C) (!) 97.4 F (36.3 C)  TempSrc: Oral Oral Oral Oral  SpO2: 93% 96% 92% 95%  Weight:      Height:       No intake or output data in the 24 hours ending 10/10/20 1302 Filed Weights   10/04/20 2024   Weight: 51.4 kg    Examination: General: Alert and oriented x2, no acute distress HEENT: Normocephalic and atraumatic, mucous membranes are slightly dry Neck: Supple, no JVD Cardiovascular: Regular rate and rhythm, S1-S2 Lungs: Decreased breath sounds bibasilar Abdomen: Soft, nontender, nondistended, hypoactive bowel sounds Extremities: No clubbing or cyanosis or edema Neuro: No focal deficits Psychiatry: Patient is appropriate, no evidence of psychoses  Data Reviewed: I have personally reviewed following labs and imaging studies  CBC: Recent Labs  Lab 10/05/20 0527 10/06/20 0430 10/07/20 0333 10/08/20 0408 10/09/20 0418  WBC 5.5 5.1 3.8* 6.4 8.6  NEUTROABS 4.7 4.4 2.8 5.2 7.4  HGB 10.2* 10.1* 10.9* 10.6* 10.9*  HCT 29.3* 28.9* 32.1* 31.6* 32.0*  MCV 94.5 92.6 94.4 92.9 93.0  PLT 167 178 173 177 148*   Basic Metabolic Panel: Recent Labs  Lab 10/05/20 0527 10/06/20 0430 10/07/20 0333 10/08/20 0408 10/09/20 0418 10/10/20 0354  NA 139 138 138 137 136 137  K 4.1 4.1 4.6 4.1 4.8 4.1  CL 106 107 104 103 102 104  CO2 24 24 25 24 26 22   GLUCOSE 143* 143* 185* 168* 181* 162*  BUN 41* 32* 33* 33* 33* 45*  CREATININE 1.40* 1.33* 1.28* 1.17* 1.22* 1.24*  CALCIUM 8.4* 8.0* 8.2* 8.4* 8.5* 8.5*  MG 2.3 2.0 2.0 1.8 1.8  --   PHOS 3.1 2.6 3.4 3.4 4.1  --    GFR: Estimated Creatinine Clearance: 26.4 mL/min (A) (by C-G formula based on SCr of  1.24 mg/dL (H)). Liver Function Tests: Recent Labs  Lab 10/06/20 0430 10/07/20 0333 10/08/20 0408 10/09/20 0418 10/10/20 0354  AST 39 79* 97* 76* 88*  ALT 24 52* 77* 72* 76*  ALKPHOS 33* 37* 35* 36* 37*  BILITOT 0.6 0.4 0.5 0.6 0.8  PROT 5.5* 5.9* 5.9* 6.0* 6.1*  ALBUMIN 2.4* 2.5* 2.5* 2.5* 2.5*   No results for input(s): LIPASE, AMYLASE in the last 168 hours. No results for input(s): AMMONIA in the last 168 hours. Coagulation Profile: Recent Labs  Lab 10/04/20 1455  INR 1.1   Cardiac Enzymes: No results for input(s):  CKTOTAL, CKMB, CKMBINDEX, TROPONINI in the last 168 hours. BNP (last 3 results) No results for input(s): PROBNP in the last 8760 hours. HbA1C: No results for input(s): HGBA1C in the last 72 hours. CBG: No results for input(s): GLUCAP in the last 168 hours. Lipid Profile: No results for input(s): CHOL, HDL, LDLCALC, TRIG, CHOLHDL, LDLDIRECT in the last 72 hours. Thyroid Function Tests: No results for input(s): TSH, T4TOTAL, FREET4, T3FREE, THYROIDAB in the last 72 hours. Anemia Panel: Recent Labs    10/08/20 0408 10/09/20 0418  FERRITIN 2,607* 2,853*   Sepsis Labs: Recent Labs  Lab 10/04/20 1455 10/04/20 2036 10/04/20 2245 10/06/20 0430 10/07/20 0333 10/08/20 0408 10/10/20 0354  PROCALCITON 4.58  --   --  1.33 0.68 0.43 0.59  LATICACIDVEN 6.9*  2.3* 2.4* 1.3  --   --   --   --     Recent Results (from the past 240 hour(s))  Urine culture     Status: Abnormal   Collection Time: 10/04/20  2:47 PM   Specimen: In/Out Cath Urine  Result Value Ref Range Status   Specimen Description   Final    IN/OUT CATH URINE Performed at [facility], [address] [place], [place] [address]    Special Requests   Final    NONE Performed at [facility], [address] [place], [place] [address]    Culture (A)  Final    4,000 COLONIES/mL STAPHYLOCOCCUS EPIDERMIDIS 5,000 COLONIES/mL VIRIDANS STREPTOCOCCUS Standardized susceptibility testing for this organism is not available. Performed at [facility], [address] [place], [place] [address]    Report Status 10/08/2020 FINAL  Final   Organism ID, Bacteria STAPHYLOCOCCUS EPIDERMIDIS (A)  Final      Susceptibility   Staphylococcus epidermidis - MIC*    CIPROFLOXACIN >=8 RESISTANT Resistant     GENTAMICIN <=0.5 SENSITIVE Sensitive     NITROFURANTOIN <=16 SENSITIVE Sensitive     OXACILLIN <=0.25 SENSITIVE Sensitive     TETRACYCLINE <=1 SENSITIVE Sensitive     VANCOMYCIN 1 SENSITIVE Sensitive      TRIMETH/SULFA <=10 SENSITIVE Sensitive     CLINDAMYCIN RESISTANT Resistant     RIFAMPIN <=0.5 SENSITIVE Sensitive     Inducible Clindamycin POSITIVE Resistant     * 4,000 COLONIES/mL STAPHYLOCOCCUS EPIDERMIDIS  Blood culture (routine single)     Status: None   Collection Time: 10/04/20  8:36 PM   Specimen: BLOOD  Result Value Ref Range Status   Specimen Description BLOOD LEFT ANTECUBITAL  Final   Special Requests   Final    BOTTLES DRAWN AEROBIC AND ANAEROBIC Blood Culture adequate volume   Culture   Final    NO GROWTH 5 DAYS Performed at [facility], [address] [place], [place] [address]    Report Status 10/09/2020 FINAL  Final         Radiology  Studies: NM Pulmonary Perfusion  Result Date: 10/10/2020 CLINICAL DATA:  Respiratory failure EXAM: NUCLEAR MEDICINE PERFUSION LUNG SCAN TECHNIQUE: Perfusion images were obtained in multiple projections after intravenous injection of radiopharmaceutical. Ventilation scans intentionally deferred if perfusion scan and chest x-ray adequate for interpretation during COVID 19 epidemic. RADIOPHARMACEUTICALS:  4.52 mCi Tc-50m MAA IV COMPARISON:  Chest x-ray 10/09/2020 FINDINGS: Examination is limited as lateral acquisitions were unable to be obtained secondary to patient condition. Within this limitation. No segmental perfusion defects are identified. IMPRESSION: 1. Limited exam. 2. Low probability for pulmonary embolism. Electronically Signed   By: Duanne Guess D.O.   On: 10/10/2020 12:36   DG Chest Port 1 View  Result Date: 10/09/2020 CLINICAL DATA:  Acute respiratory failure EXAM: PORTABLE CHEST 1 VIEW COMPARISON:  10/04/2020 FINDINGS: Cardiac shadow is within normal limits. Patchy linear opacities are again identified in the bases bilaterally slightly increased from the prior exam. No sizable effusion is seen. No bony abnormality is noted. IMPRESSION: Slight increase in the degree of streaky opacities in the bases bilaterally.  Electronically Signed   By: Alcide Clever M.D.   On: 10/09/2020 18:28        Scheduled Meds: . dexamethasone  6 mg Oral Q24H  . enoxaparin (LOVENOX) injection  30 mg Subcutaneous Q24H  . feeding supplement  237 mL Oral BID BM  . Ipratropium-Albuterol  1 puff Inhalation Q6H  . mirabegron ER  50 mg Oral Daily  . pantoprazole  40 mg Oral Daily   Continuous Infusions: . ceFEPime (MAXIPIME) IV 2 g (10/10/20 1241)  . vancomycin    . [START ON 10/11/2020] vancomycin      Assessment & Plan:   Active Problems:   GERD (gastroesophageal reflux disease)   HLD (hyperlipidemia)   Hypertension   Osteoporosis, post-menopausal   OAB (overactive bladder)   Acute hypoxemic respiratory failure due to COVID-19 [facility])   Anxiety   Chronic right lower quadrant pain   CRI (chronic renal insufficiency), stage 3 (moderate) (HCC)    Severe sepsis, present on admission secondary to bacterial pneumonia and COVID causing acute respiratory failure with hypoxia, present on admission: Patient meets criteria for severe sepsis on admission given lactic acidosis, tachypnea, tachycardia.  Sepsis episode stabilized.  Breathing initially improved, but has since worsened.  Given bump in CRP and ferritin levels minimally changed from previous day, I am concerned about worsening COVID, especially in lieu of only getting steroids and not Remdisivir.  Reaffirmed with daughter that they refused Remdisivir.  Today, increased oxygen requirement.  Chest x-ray more consistent with worsening COVID.  CRP equivocal.  VQ scan ruled out PE Procalcitonin minimally elevated so I added broad-spectrum antibiotics.  #seizure like episode-at home Has hx/o seizures a decade ago .was on Dilantin and weaned off years ago.  Neurology's input was appreciated-clinical events certainly could be seizure, in this case provoked by infection and treatment with fluoroquinolone No indication for EEG, she already has history of epilepsy No indication  to start AED capitalize for single provoked seizure at this time.  If she were to have a second event we will consider starting AED at that time Patient counseled not to drive for at least 6 months after last seizure per [place] law If she has any further events she may seek referral to neurology from her outpatient PCP 5/31 MRI of brain was obtained for any new pathology which was negative for any acute abnormality  No seizure activity while hospitalized  Neurology signed off  Elevated LFT- likely from covid.  Recheck in the morning.  Not on Remdisivir and statin was discontinued.  Bilateral upper quadrant ultrasound negative.  GI consulted who felt that this could be likely from infection.  Viral hepatitis panel negative.   #AKI on CKD 3b Likely prerenal.  Improved with IV fluids and creatinine down to 1.2 however GFR at 43.  We gave her IV fluids, will she likely will third space due to malnutrition.   #Elevated troponin-minimal Likely demand ischemia No chest pain  Hypertension-bp low to nml Continue to hold home bp meds.  Osteoporosis-hold alendronate/supplements to reduce trips in and out of room  Hyperlipidemia-continue statin  OAB-continue mirabegron  GERD-continue PPI   DVT prophylaxis: Lovenox Code Status: Full Family Communication: Updated daughter by phone  Status is: Inpatient  Remains inpatient appropriate because:Inpatient level of care appropriate due to severity of illness   Dispo: The patient is from: Home              Anticipated d/c is to: Home              Patient currently is not medically stable to d/c.   Difficult to place patient No      LOS: 6 days     Hollice Espy, MD [facility] Pager [phone]  If 7PM-7AM, please contact night-coverage 10/10/2020, 1:02 PM

## 2020-10-10 NOTE — Consult Note (Signed)
Pharmacy Antibiotic Note  April Weaver is a 87 y.o. female w/ h/o HTN, HLD, OAB, anxiety, CKD admitted on 10/04/2020 after possible seizure-like episode ISO sepsis with recent covid diagnosis and c/f superimposed bacterial pneumonia.  Pharmacy has been consulted for vancomycin & cefepime dosing.  Plan:  Continue Cefepime 2g q24h (5/28 >>  Vancomycin 1g x1 loading dose (~20mg /kg); then 500mg  q24h (~15mg /kg)  Predicted AUC 513; cmax 28.8; cmin 15.5 (scr 1.24; TBW; 0.72 Vd)  Height: 5\' 3"  (160 cm) Weight: 51.4 kg (113 lb 5.1 oz) IBW/kg (Calculated) : 52.4  Temp (24hrs), Avg:98.5 F (36.9 C), Min:97.6 F (36.4 C), Max:99.8 F (37.7 C)  Recent Labs  Lab 10/04/20 1455 10/04/20 2036 10/04/20 2245 10/05/20 0527 10/06/20 0430 10/07/20 0333 10/08/20 0408 10/09/20 0418 10/10/20 0354  WBC 8.2  --   --  5.5 5.1 3.8* 6.4 8.6  --   CREATININE 2.15*  --   --  1.40* 1.33* 1.28* 1.17* 1.22* 1.24*  LATICACIDVEN 6.9*  2.3* 2.4* 1.3  --   --   --   --   --   --     Estimated Creatinine Clearance: 26.4 mL/min (A) (by C-G formula based on SCr of 1.24 mg/dL (H)).    Allergies  Allergen Reactions  . Celecoxib Other (See Comments)    Shakiness  . Metronidazole Nausea Only  . Typhoid Vaccines Swelling  . Penicillins Rash    Antimicrobials this admission: Vanc (6/2 >>  CFP (5/28 >>   Dose adjustments this admission: Borderline for adjustment if Scr improves  Microbiology results: 5/27 BCx: (single) NGTD 5/27 UCx: 4k CFU staph Epi (CPX, Clinda -R) & 5k CFU VGS  6/2 MRSA PCR: ordered  Thank you for allowing pharmacy to be a part of this patient's care.  6/27 Percy Winterrowd 10/10/2020 10:01 AM

## 2020-10-11 DIAGNOSIS — E43 Unspecified severe protein-calorie malnutrition: Secondary | ICD-10-CM | POA: Insufficient documentation

## 2020-10-11 LAB — BASIC METABOLIC PANEL
Anion gap: 8 (ref 5–15)
BUN: 46 mg/dL — ABNORMAL HIGH (ref 8–23)
CO2: 21 mmol/L — ABNORMAL LOW (ref 22–32)
Calcium: 8.5 mg/dL — ABNORMAL LOW (ref 8.9–10.3)
Chloride: 106 mmol/L (ref 98–111)
Creatinine, Ser: 1.13 mg/dL — ABNORMAL HIGH (ref 0.44–1.00)
GFR, Estimated: 47 mL/min — ABNORMAL LOW (ref >60)
Glucose, Bld: 176 mg/dL — ABNORMAL HIGH (ref 70–99)
Potassium: 4.5 mmol/L (ref 3.5–5.1)
Sodium: 135 mmol/L (ref 135–145)

## 2020-10-11 LAB — C-REACTIVE PROTEIN: CRP: 8.4 mg/dL — ABNORMAL HIGH (ref <1.0)

## 2020-10-11 MED ORDER — ADULT MULTIVITAMIN W/MINERALS CH
1.0000 | ORAL_TABLET | Freq: Every day | ORAL | Status: DC
  Administered 2020-10-11 – 2020-10-17 (×7): 1 via ORAL
  Filled 2020-10-11 (×7): qty 1

## 2020-10-11 MED ORDER — ENSURE ENLIVE PO LIQD
237.0000 mL | Freq: Three times a day (TID) | ORAL | Status: DC
  Administered 2020-10-11 – 2020-10-17 (×14): 237 mL via ORAL

## 2020-10-11 MED ORDER — SIMETHICONE 80 MG PO CHEW
80.0000 mg | CHEWABLE_TABLET | Freq: Once | ORAL | Status: AC
  Administered 2020-10-11: 06:00:00 80 mg via ORAL
  Filled 2020-10-11: qty 1

## 2020-10-11 NOTE — Progress Notes (Signed)
PROGRESS NOTE    April ArgyleBeverly Joyce Trice  ZOX:[id number] DOB: 04-Jun-1931 DOA: 10/04/2020 PCP: Lorenso QuarryLeach, Shannon, NP    Brief Narrative:  April Weaver is a 87 y.o. female with history of hypertension, hyperlipidemia, osteoporosis, overactive bladder, anxiety, chronic right lower quadrant pain, CKD, who presented to the emergency room on 5/27 after possible seizure episode was noticed by family at home.Patient was recently diagnosed on May 24 with COVID.  She was prescribed Levaquin, prednisone, and symptomatic medications.  No further seizures in the emergency room.  Admitted to the hospitalist service.  Noted to be hypoxic requiring 2 to 3 L of oxygen by nasal cannula.  LFTs also noted to be elevated, so GI consulted.  Procalcitonin also mildly elevated and patient put on cefepime.  Patient treated with steroids.  Patient and daughter have refused Remdisivir over concerns of renal failure.  Patient was showing signs of improvement with oxygenation improving, however on night of 5/31, oxygenation worsened and patient currently on 7 L high flow nasal cannula.  V/Q ruled out PE.  Procalcitonin minimally elevated with CRP which previously had been trending downward starting to come back up.  Vancomycin and cefepime added.  By 6/2, patient requiring 10 L high flow nasal cannula.    Today, doing a little better, down to 9L HFNC.  Still very worn out, not much appetite.    Consultants:  neurology  Procedures:  VQ scan 6/2: Low probability of pulmonary embolus  Antimicrobials:   IV cefepime 6/2-present  IV vancomycin 6/2-present    Objective: Vitals:   10/11/20 0510 10/11/20 0600 10/11/20 0805 10/11/20 1118  BP: 115/68  112/69 (!) 113/50  Pulse: 66 73 69 80  Resp: 18  16 18   Temp: 97.9 F (36.6 C)  98 F (36.7 C) 98.2 F (36.8 C)  TempSrc: Oral     SpO2: 96% 93% (!) 88% 94%  Weight:      Height:        Intake/Output Summary (Last 24 hours) at 10/11/2020 1235 Last data filed at  10/11/2020 0500 Gross per 24 hour  Intake 832.78 ml  Output --  Net 832.78 ml   Filed Weights   10/04/20 2024  Weight: 51.4 kg    Examination: General: Alert and oriented x2, fatigued HEENT: Normocephalic and atraumatic, mucous membranes are slightly dry Neck: Supple, no JVD Cardiovascular: Regular rate and rhythm, S1-S2 Lungs: Decreased breath sounds bibasilar Abdomen: Soft, nontender, nondistended, hypoactive bowel sounds Extremities: No clubbing or cyanosis or edema Neuro: No focal deficits Psychiatry: Patient is appropriate, no evidence of psychoses  Data Reviewed: I have personally reviewed following labs and imaging studies  CBC: Recent Labs  Lab 10/05/20 0527 10/06/20 0430 10/07/20 0333 10/08/20 0408 10/09/20 0418  WBC 5.5 5.1 3.8* 6.4 8.6  NEUTROABS 4.7 4.4 2.8 5.2 7.4  HGB 10.2* 10.1* 10.9* 10.6* 10.9*  HCT 29.3* 28.9* 32.1* 31.6* 32.0*  MCV 94.5 92.6 94.4 92.9 93.0  PLT 167 178 173 177 148*   Basic Metabolic Panel: Recent Labs  Lab 10/05/20 0527 10/06/20 0430 10/07/20 0333 10/08/20 0408 10/09/20 0418 10/10/20 0354 10/11/20 0431  NA 139 138 138 137 136 137 135  K 4.1 4.1 4.6 4.1 4.8 4.1 4.5  CL 106 107 104 103 102 104 106  CO2 24 24 25 24 26 22  21*  GLUCOSE 143* 143* 185* 168* 181* 162* 176*  BUN 41* 32* 33* 33* 33* 45* 46*  CREATININE 1.40* 1.33* 1.28* 1.17* 1.22* 1.24* 1.13*  CALCIUM 8.4* 8.0*  8.2* 8.4* 8.5* 8.5* 8.5*  MG 2.3 2.0 2.0 1.8 1.8  --   --   PHOS 3.1 2.6 3.4 3.4 4.1  --   --    GFR: Estimated Creatinine Clearance: 29 mL/min (A) (by C-G formula based on SCr of 1.13 mg/dL (H)). Liver Function Tests: Recent Labs  Lab 10/06/20 0430 10/07/20 0333 10/08/20 0408 10/09/20 0418 10/10/20 0354  AST 39 79* 97* 76* 88*  ALT 24 52* 77* 72* 76*  ALKPHOS 33* 37* 35* 36* 37*  BILITOT 0.6 0.4 0.5 0.6 0.8  PROT 5.5* 5.9* 5.9* 6.0* 6.1*  ALBUMIN 2.4* 2.5* 2.5* 2.5* 2.5*   No results for input(s): LIPASE, AMYLASE in the last 168 hours. No  results for input(s): AMMONIA in the last 168 hours. Coagulation Profile: Recent Labs  Lab 10/04/20 1455  INR 1.1   Cardiac Enzymes: No results for input(s): CKTOTAL, CKMB, CKMBINDEX, TROPONINI in the last 168 hours. BNP (last 3 results) No results for input(s): PROBNP in the last 8760 hours. HbA1C: No results for input(s): HGBA1C in the last 72 hours. CBG: No results for input(s): GLUCAP in the last 168 hours. Lipid Profile: No results for input(s): CHOL, HDL, LDLCALC, TRIG, CHOLHDL, LDLDIRECT in the last 72 hours. Thyroid Function Tests: No results for input(s): TSH, T4TOTAL, FREET4, T3FREE, THYROIDAB in the last 72 hours. Anemia Panel: Recent Labs    10/09/20 0418  FERRITIN 2,853*   Sepsis Labs: Recent Labs  Lab 10/04/20 1455 10/04/20 2036 10/04/20 2245 10/06/20 0430 10/07/20 0333 10/08/20 0408 10/10/20 0354  PROCALCITON 4.58  --   --  1.33 0.68 0.43 0.59  LATICACIDVEN 6.9*  2.3* 2.4* 1.3  --   --   --   --     Recent Results (from the past 240 hour(s))  Urine culture     Status: Abnormal   Collection Time: 10/04/20  2:47 PM   Specimen: In/Out Cath Urine  Result Value Ref Range Status   Specimen Description   Final    IN/OUT CATH URINE Performed at [facility], [address], [place], [place] [address]    Special Requests   Final    NONE Performed at [facility], [address] [place], [place] [address]    Culture (A)  Final    4,000 COLONIES/mL STAPHYLOCOCCUS EPIDERMIDIS 5,000 COLONIES/mL VIRIDANS STREPTOCOCCUS Standardized susceptibility testing for this organism is not available. Performed at [facility], [address], [place], [place] [address]    Report Status 10/08/2020 FINAL  Final   Organism ID, Bacteria STAPHYLOCOCCUS EPIDERMIDIS (A)  Final      Susceptibility   Staphylococcus epidermidis - MIC*    CIPROFLOXACIN >=8 RESISTANT Resistant     GENTAMICIN <=0.5 SENSITIVE Sensitive     NITROFURANTOIN <=16  SENSITIVE Sensitive     OXACILLIN <=0.25 SENSITIVE Sensitive     TETRACYCLINE <=1 SENSITIVE Sensitive     VANCOMYCIN 1 SENSITIVE Sensitive     TRIMETH/SULFA <=10 SENSITIVE Sensitive     CLINDAMYCIN RESISTANT Resistant     RIFAMPIN <=0.5 SENSITIVE Sensitive     Inducible Clindamycin POSITIVE Resistant     * 4,000 COLONIES/mL STAPHYLOCOCCUS EPIDERMIDIS  Blood culture (routine single)     Status: None   Collection Time: 10/04/20  8:36 PM   Specimen: BLOOD  Result Value Ref Range Status   Specimen Description BLOOD LEFT ANTECUBITAL  Final   Special Requests   Final    BOTTLES DRAWN AEROBIC AND ANAEROBIC Blood Culture adequate  volume   Culture   Final    NO GROWTH 5 DAYS Performed at [facility], [address] [place] [place], [place] [address]    Report Status 10/09/2020 FINAL  Final         Radiology Studies: NM Pulmonary Perfusion  Result Date: 10/10/2020 CLINICAL DATA:  Respiratory failure EXAM: NUCLEAR MEDICINE PERFUSION LUNG SCAN TECHNIQUE: Perfusion images were obtained in multiple projections after intravenous injection of radiopharmaceutical. Ventilation scans intentionally deferred if perfusion scan and chest x-ray adequate for interpretation during COVID 19 epidemic. RADIOPHARMACEUTICALS:  4.52 mCi Tc-45m MAA IV COMPARISON:  Chest x-ray 10/09/2020 FINDINGS: Examination is limited as lateral acquisitions were unable to be obtained secondary to patient condition. Within this limitation. No segmental perfusion defects are identified. IMPRESSION: 1. Limited exam. 2. Low probability for pulmonary embolism. Electronically Signed   By: Duanne Guess D.O.   On: 10/10/2020 12:36   DG Chest Port 1 View  Result Date: 10/09/2020 CLINICAL DATA:  Acute respiratory failure EXAM: PORTABLE CHEST 1 VIEW COMPARISON:  10/04/2020 FINDINGS: Cardiac shadow is within normal limits. Patchy linear opacities are again identified in the bases bilaterally slightly increased from the prior exam. No  sizable effusion is seen. No bony abnormality is noted. IMPRESSION: Slight increase in the degree of streaky opacities in the bases bilaterally. Electronically Signed   By: Alcide Clever M.D.   On: 10/09/2020 18:28        Scheduled Meds: . dexamethasone  6 mg Oral Q24H  . enoxaparin (LOVENOX) injection  30 mg Subcutaneous Q24H  . feeding supplement  237 mL Oral TID BM  . Ipratropium-Albuterol  1 puff Inhalation Q6H  . mirabegron ER  50 mg Oral Daily  . multivitamin with minerals  1 tablet Oral Daily  . pantoprazole  40 mg Oral Daily   Continuous Infusions: . ceFEPime (MAXIPIME) IV 2 g (10/11/20 1008)  . vancomycin 500 mg (10/11/20 1112)    Assessment & Plan:   Active Problems:   GERD (gastroesophageal reflux disease)   HLD (hyperlipidemia)   Hypertension   Osteoporosis, post-menopausal   OAB (overactive bladder)   Acute hypoxemic respiratory failure due to COVID-19 [facility])   Anxiety   Chronic right lower quadrant pain   CRI (chronic renal insufficiency), stage 3 (moderate) (HCC)   Protein-calorie malnutrition, severe    Severe sepsis, present on admission secondary to bacterial pneumonia and COVID causing acute respiratory failure with hypoxia, present on admission: Patient meets criteria for severe sepsis on admission given lactic acidosis, tachypnea, tachycardia.  Sepsis episode stabilized.  Breathing initially improved, but has since worsened.  Given rising CRP and ferritin levels minimally changed from previous, I am concerned about worsening COVID, especially in lieu of only getting steroids and not Remdisivir.  Reaffirmed with daughter that they refused Remdisivir.  Continue to high oxygen requirement although given slight improvement today, hopefully this will continue.  Chest x-ray more consistent with worsening COVID.  CRP equivocal.  VQ scan ruled out PE.  Procalcitonin minimally elevated so I added broad-spectrum antibiotics.  Follow-up procalcitonin level in the  morning.  #seizure like episode-at home Has hx/o seizures a decade ago .was on Dilantin and weaned off years ago.  Neurology's input was appreciated-clinical events certainly could be seizure, in this case provoked by infection and treatment with fluoroquinolone No indication for EEG, she already has history of epilepsy No indication to start AED capitalize for single provoked seizure at this time.  If she were to have a second event  we will consider starting AED at that time Patient counseled not to drive for at least 6 months after last seizure per [place] law If she has any further events she may seek referral to neurology from her outpatient PCP 5/31 MRI of brain was obtained for any new pathology which was negative for any acute abnormality  No seizure activity while hospitalized  Neurology signed off    Elevated LFT- likely from covid.  Recheck in the morning.  Not on Remdisivir and statin was discontinued.  Bilateral upper quadrant ultrasound negative.  GI consulted who felt that this could be likely from infection.  Viral hepatitis panel negative.   #AKI on CKD 3b Likely prerenal.  Improved with IV fluids and creatinine down to 1.2 however GFR at 43.  We gave her IV fluids, will she likely will third space due to malnutrition.  Severe protein calorie malnutrition: Nutrition Status: Nutrition Problem: Severe Malnutrition Etiology: chronic illness Signs/Symptoms: moderate fat depletion,severe fat depletion,severe muscle depletion Interventions: Ensure Enlive (each supplement provides 350kcal and 20 grams of protein),Magic cup,MVI   #Elevated troponin-minimal Likely demand ischemia No chest pain  Hypertension-bp low to nml Continue to hold home bp meds.  Osteoporosis-hold alendronate/supplements to reduce trips in and out of room  Hyperlipidemia-continue statin  OAB-continue mirabegron  GERD-continue PPI   DVT prophylaxis: Lovenox Code Status: Full Family Communication:  Updated daughter by phone  Status is: Inpatient  Remains inpatient appropriate because:Inpatient level of care appropriate due to severity of illness   Dispo: The patient is from: Home              Anticipated d/c is to: Home              Patient currently is not medically stable to d/c.   Difficult to place patient No      LOS: 7 days     Hollice Espy, MD [facility] Pager [phone]  If 7PM-7AM, please contact night-coverage 10/11/2020, 12:35 PM

## 2020-10-11 NOTE — Discharge Instructions (Signed)
Nutrition Post Hospital Stay Proper nutrition can help your body recover from illness and injury.   Foods and beverages high in protein, vitamins, and minerals help rebuild muscle loss, promote healing, & reduce fall risk.   .In addition to eating healthy foods, a nutrition shake is an easy, delicious way to get the nutrition you need during and after your hospital stay  It is recommended that you continue to drink 2 bottles per day of: Ensure for at least 1 month (30 days) after your hospital stay and when you do not eat a full meal.   Tips for adding a nutrition shake into your routine: As allowed, drink one with vitamins or medications instead of water or juice Enjoy one as a tasty mid-morning or afternoon snack Drink cold or make a milkshake out of it Drink one instead of milk with cereal or snacks Use as a coffee creamer   Available at the following grocery stores and pharmacies:           * Karin Golden * [employer] * [employer]co  * R[employer] Aid          * Walmart * [employer]  * Walgreens      * Target  * BJ's   * CVS  * [employer]   * [facility] [phone]            For COUPONS visit: www.ensure.com/join or RoleLink.com.br   Suggested Substitutions Ensure Plus = Boost Plus = Carnation Breakfast Essentials = Boost Compact Ensure Active Clear = Boost Breeze Glucerna Shake = Boost Glucose Control = Carnation Breakfast Essentials SUGAR FREE  Suggestions For Increasing Calories And Protein . Several small meals a day are easier to eat and digest than three large ones. Space meals about 2 to 3 hours apart to maximize comfort. . Stop eating 2 to 3 hours before bed and sleep with your head elevated if gastric reflux (GERD) and heartburn are problems. . Do not eat your favorite foods if you are feeling bad. Save them for when you feel good! . Eat breakfast-type foods at any meal. Eggs are usually easy to eat and are great any time of the day. (The same  goes for pancakes and waffles.) . Eat when you feel hungry. Most people have the greatest appetite in the morning because they have not eaten all night. If this is the best meal for you, then pile on those calories and other nutrients in the morning and at lunch. Then you can have a smaller dinner without losing total calories for the day. . Eat leftovers or nutritious snacks in the afternoon and early evening to round out your day. . Try homemade or commercially prepared nutrition bars and puddings, as well as calorie- and protein-rich liquid nutritional supplements. Benefits of Physical Activity Talk to your doctor about physical activity. Light or moderate physical activity can help maintain muscle and promote an appetite. Walking in the neighborhood or the local mall is a great way to get up, get out, and get moving. If you are unsteady on your feet, try walking around the dining room table. Save Room for [employer]! Drink most fluids between meals instead of with meals. (It is fine to have a sip to help swallow food at meal time.) Fluids (which usually have fewer calories and nutrients than solid food) can take up valuable space in your stomach.  Foods Recommended High-Protein Foods Milk products Add cheese to toast, crackers, sandwiches, baked potatoes,  vegetables, soups, noodles, meat, and fruit. Use reduced-fat (2%) or whole milk in place of water when cooking cereal and cream soups. Include cream sauces on vegetables and pasta. Add powdered milk to cream soups and mashed potatoes.  Eggs Have hard-cooked eggs readily available in the refrigerator. Chop and add to salads, casseroles, soups, and vegetables. Make a quick egg salad. All eggs should be well cooked to avoid the risk of harmful bacteria.  Meats, poultry, and fish Add leftover cooked meats to soups, casseroles, salads, and omelets. Make dip by mixing diced, chopped, or shredded meat with sour cream and spices.  Beans, legumes,  nuts, and seeds Sprinkle nuts and seeds on cereals, fruit, and desserts such as ice cream, pudding, and custard. Also serve nuts and seeds on vegetables, salads, and pasta. Spread peanut butter on toast, bread, English muffins, and fruit, or blend it in a milk shake. Add beans and peas to salads, soups, casseroles, and vegetable dishes.  High-Calorie Foods Butter, margarine, and  oils Melt butter or margarine over potatoes, rice, pasta, and cooked vegetables. Add melted butter or margarine into soups and casseroles and spread on bread for sandwiches before spreading sandwich spread or peanut butter. Saut or stir-fry vegetables, meats, chicken and fish such as shrimp/scallops in olive or canola oil. A variety of oils add calories and can be used to [employer], chicken, or fish.  Milk products Add whipping cream to desserts, pancakes, waffles, fruit, and hot chocolate, and fold it into soups and casseroles. Add sour cream to baked potatoes and vegetables.  Salad dressing Use regular (not low-fat or diet) mayonnaise and salad dressing on sandwiches and in dips with vegetables and fruit.   Sweets Add jelly and honey to bread and crackers. Add jam to fruit and ice cream and as a topping over cake.   Copyright 2020  [employer]y [employer]. All rights reserved.

## 2020-10-11 NOTE — Progress Notes (Signed)
Initial Nutrition Assessment  DOCUMENTATION CODES:  Severe malnutrition in context of chronic illness  INTERVENTION:  Continue current diet order.  Add Ensure Enlive po TID, each supplement provides 350 kcal and 20 grams of protein.  Add Magic cup TID with meals, each supplement provides 290 kcal and 9 grams of protein.  Add MVI with minerals daily.  Encourage intake at meals and with supplements.  NUTRITION DIAGNOSIS:  Severe Malnutrition related to chronic illness as evidenced by moderate fat depletion,severe fat depletion,severe muscle depletion.  GOAL:  Patient will meet greater than or equal to 90% of their needs  MONITOR:  PO intake,Supplement acceptance,Labs,Weight trends,I & O's  REASON FOR ASSESSMENT:  Consult Assessment of nutrition requirement/status  ASSESSMENT:  87 yo female with a PMH of HTN, HLD, osteoporosis, overactive bladder, anxiety, chronic RLQ pain, and CKD stage 3b who presents with acute hypoxic respiratory failure d/t COVID-19, severe sepsis 2/2 bacterial PNA, and possible seizure.  Spoke with pt at bedside. Pt reports that she has not felt like eating at home or at the hospital for a while now. She reports that her daughter brought her Ensure at home to try and she drinks that sometimes, but not daily. She is adamant to start to feel better and wants to eat, but she has no appetite.  No recent weight history documented in Epic. Per Care Everywhere, pt's weight has remained stable since 06/24/2020 at 111 lbs. Pt's current wt is 113 lbs.  Pt with very little fat and no muscle stores on exam.  Given above information, pt is severely malnourished.  Recommend adding Ensure Enlive TID and Magic Cup TID to promote caloric and protein intake. Also recommend adding MVI with minerals daily.  Medications: reviewed; dexamethasone, EE BID, Protonix, cefepime, vancomycin  Labs: reviewed; Glucose 176  NUTRITION - FOCUSED PHYSICAL EXAM: Flowsheet Row Most  Recent Value  Orbital Region Moderate depletion  Upper Arm Region Severe depletion  Thoracic and Lumbar Region Moderate depletion  Buccal Region Severe depletion  Temple Region Severe depletion  Clavicle Bone Region Severe depletion  Clavicle and Acromion Bone Region Severe depletion  Scapular Bone Region Severe depletion  Dorsal Hand Severe depletion  Patellar Region Severe depletion  Anterior Thigh Region Severe depletion  Posterior Calf Region Severe depletion  Edema (RD Assessment) None  Hair Reviewed  Eyes Reviewed  Mouth Reviewed  Skin Reviewed  Nails Reviewed     Diet Order:   Diet Order            Diet regular Room service appropriate? Yes; Fluid consistency: Thin  Diet effective now                EDUCATION NEEDS:  Education needs have been addressed  Skin:  Skin Assessment: Reviewed RN Assessment  Last BM:  PTA/unknown  Height:  Ht Readings from Last 1 Encounters:  10/04/20 5\' 3"  (1.6 m)   Weight:  Wt Readings from Last 1 Encounters:  10/04/20 51.4 kg   Ideal Body Weight:  52.3 kg  BMI:  Body mass index is 20.07 kg/m.  Estimated Nutritional Needs:  Kcal:  1650-1850 Protein:  75-90 grams Fluid:  >1.65 L  10/06/20, RD, LDN Registered Dietitian After Hours/Weekend Pager # in [place]

## 2020-10-11 NOTE — Progress Notes (Addendum)
Physical Therapy Treatment Patient Details Name: April Weaver MRN: [id number] DOB: [birth year] Today's Date: 10/11/2020    History of Present Illness presented to ER secondary to possible seizure activity; admitted for management of severe sepsis, acute hypoxic respiratory failure related to COVID-19.    PT Comments    Patient generally weak and fatigued, but agreeable to participation as tolerated, "I'll do what I can".  Completes bed mobility with mod indep; sit/stand, SPT and very short-distance gait (4-5 steps) without assist device, min assist.  Demonstrates 3-4 steps from bed/recliner without assist device, min assist; constantly reaching for armrests/external support for stabilization.  Generally weak and unsteady; would benefit from use of RW for longer, more functional, distances.  BORG 5/10 after very minimal gait distances; sats 93-94% on 10L HFNC (portable tank doesn't allow 9L); left on 9L end of session, sats 93%. Requires frequent and extended rest periods with each increment of activity; fair awareness of fatigue levels, self-initiating rest periods and self-pacing activities as needed. Given change in medical status and generalized decline in function/activity tolerance, updating discharge recommendation to reflect STR at discharge.  Will continue to monitor/update as medically appropriate.  TOC informed/aware.     Follow Up Recommendations  SNF     Equipment Recommendations  Rolling walker with 5" wheels;3in1 (PT)    Recommendations for Other Services       Precautions / Restrictions Precautions Precautions: Fall    Mobility  Bed Mobility Overal bed mobility: Modified Independent                  Transfers Overall transfer level: Needs assistance Equipment used: None Transfers: Sit to/from Stand;Stand Pivot Transfers Sit to Stand: Min assist         General transfer comment: requires UE support throughout transfer; generally weak and  unsteady  Ambulation/Gait Ambulation/Gait assistance: Min assist Gait Distance (Feet): 5 Feet Assistive device: None       General Gait Details: 3-4 steps from bed/recliner without assist device, min assist; constantly reaching for armrests/external support for stabilization.  Generally weak and unsteady; would benefit from use of RW for longer, more functional, distances.  BORG 5/10 after very minimal gait distances; sats 93-94% on 10L HFNC.   Stairs             Wheelchair Mobility    Modified Rankin (Stroke Patients Only)       Balance Overall balance assessment: Needs assistance Sitting-balance support: No upper extremity supported;Feet supported Sitting balance-Leahy Scale: Good     Standing balance support: No upper extremity supported Standing balance-Leahy Scale: Fair                              Cognition Arousal/Alertness: Awake/alert Behavior During Therapy: Flat affect Overall Cognitive Status: Within Functional Limits for tasks assessed                                        Exercises      General Comments        Pertinent Vitals/Pain Pain Assessment: No/denies pain    Home Living                      Prior Function            PT Goals (current goals can now be found in the care  plan section) Acute Rehab PT Goals Patient Stated Goal: to return home PT Goal Formulation: With patient Time For Goal Achievement: 10/21/20 Potential to Achieve Goals: Good Progress towards PT goals: Progressing toward goals    Frequency    Min 2X/week      PT Plan Discharge plan needs to be updated    Co-evaluation              AM-PAC PT "6 Clicks" Mobility   Outcome Measure  Help needed turning from your back to your side while in a flat bed without using bedrails?: None Help needed moving from lying on your back to sitting on the side of a flat bed without using bedrails?: None Help needed moving to  and from a bed to a chair (including a wheelchair)?: A Little Help needed standing up from a chair using your arms (e.g., wheelchair or bedside chair)?: A Little Help needed to walk in hospital room?: A Little Help needed climbing 3-5 steps with a railing? : A Lot 6 Click Score: 19    End of Session Equipment Utilized During Treatment: Gait belt;Oxygen Activity Tolerance: Patient limited by fatigue Patient left: in chair;with call bell/phone within reach Nurse Communication: Mobility status PT Visit Diagnosis: Muscle weakness (generalized) (M62.81);Difficulty in walking, not elsewhere classified (R26.2)     Time: 6063-0160 PT Time Calculation (min) (ACUTE ONLY): 19 min  Charges:  $Therapeutic Activity: 8-22 mins                     Shaunessy Dobratz H. Manson Passey, PT, DPT, NCS 10/11/20, 3:05 PM [phone]

## 2020-10-12 LAB — C-REACTIVE PROTEIN: CRP: 7.9 mg/dL — ABNORMAL HIGH (ref <1.0)

## 2020-10-12 LAB — MRSA PCR SCREENING: MRSA by PCR: NEGATIVE

## 2020-10-12 LAB — PROCALCITONIN: Procalcitonin: 0.34 ng/mL

## 2020-10-12 LAB — CREATININE, SERUM
Creatinine, Ser: 1.05 mg/dL — ABNORMAL HIGH (ref 0.44–1.00)
GFR, Estimated: 52 mL/min — ABNORMAL LOW (ref >60)

## 2020-10-12 MED ORDER — ENOXAPARIN SODIUM 40 MG/0.4ML IJ SOSY
40.0000 mg | PREFILLED_SYRINGE | INTRAMUSCULAR | Status: DC
  Administered 2020-10-12 – 2020-10-13 (×2): 40 mg via SUBCUTANEOUS
  Filled 2020-10-12 (×2): qty 0.4

## 2020-10-12 MED ORDER — POLYETHYLENE GLYCOL 3350 17 G PO PACK
17.0000 g | PACK | Freq: Every day | ORAL | Status: DC
  Administered 2020-10-13 – 2020-10-16 (×4): 17 g via ORAL
  Filled 2020-10-12 (×6): qty 1

## 2020-10-12 MED ORDER — SODIUM CHLORIDE 0.9 % IV SOLN
2.0000 g | Freq: Two times a day (BID) | INTRAVENOUS | Status: DC
  Administered 2020-10-12 – 2020-10-13 (×3): 2 g via INTRAVENOUS
  Filled 2020-10-12 (×5): qty 2

## 2020-10-12 NOTE — TOC Progression Note (Signed)
Transition of Care [facility]) - Progression Note    Patient Details  Name: April Weaver MRN: [id number] Date of Birth: 10/21/1931  Transition of Care [facility]) CM/SW Contact  Gildardo Griffes, [place] Phone Number: 10/12/2020, 9:06 AM  Clinical Narrative:     CSW spoke with patient regarding change in recommendation from home health to SNF. Patient reports she lives with her daughter April Weaver who is a Engineer, civil (consulting) and to reach out to her.   CSW called April Weaver, updated on change in discharge venue recommendation. She reports she also noted increased weakness in patient yesterday and is in agreement with SNF however she has not discussed this with patient. April Weaver reports she's visiting patient this afternoon to have discussion, she will call CSW after to let me know what they decide as far as discharge plan.     Expected Discharge Plan: Home w Home Health Services Barriers to Discharge: Continued Medical Work up  Expected Discharge Plan and Services Expected Discharge Plan: Home w Home Health Services   Discharge Planning Services: CM Consult Post Acute Care Choice: Home Health Living arrangements for the past 2 months: Single Family Home                 DME Arranged: Oxygen DME Agency: [employer]       HH Arranged: RN,PT,OT HH Agency: [facility] Date HH Agency Contacted: 10/07/20 Time HH Agency Contacted: 1200 Representative spoke with at [facility] Agency: Barbara Cower   Social Determinants of Health (SDOH) Interventions    Readmission Risk Interventions No flowsheet data found.

## 2020-10-12 NOTE — Progress Notes (Signed)
PROGRESS NOTE    April Weaver  YBO:[id number] DOB: Nov 22, 1931 DOA: 10/04/2020 PCP: Lorenso Quarry, NP    Brief Narrative:  April Weaver is a 87 y.o. female with history of hypertension, hyperlipidemia, osteoporosis, overactive bladder, anxiety, chronic right lower quadrant pain, CKD, who presented to the emergency room on 5/27 after possible seizure episode was noticed by family at home.Patient was recently diagnosed on May 24 with COVID.  She was prescribed Levaquin, prednisone, and symptomatic medications.  No further seizures in the emergency room.  Admitted to the hospitalist service.  Noted to be hypoxic requiring 2 to 3 L of oxygen by nasal cannula.  LFTs also noted to be elevated, so GI consulted.  Procalcitonin also mildly elevated and patient put on cefepime.  Patient treated with steroids.  Patient and daughter have refused Remdisivir over concerns of renal failure.  Patient was showing signs of improvement with oxygenation improving, however on night of 5/31, oxygenation worsened and patient currently on 7 L high flow nasal cannula.  V/Q ruled out PE.  Procalcitonin minimally elevated with CRP which previously had been trending downward starting to come back up.  Vancomycin and cefepime added.  By 6/2, patient requiring 10 L high flow nasal cannula.    For the past 2 days, patient slowly improving in terms of oxygen needs, oxygen down to 7 L high flow today.  Patient feeling a little bit better, appetite slightly increasing  Consultants:  neurology  Procedures:  VQ scan 6/2: Low probability of pulmonary embolus  Antimicrobials:   IV cefepime 6/2-present  IV vancomycin 6/2-present    Objective: Vitals:   10/11/20 1720 10/11/20 1951 10/12/20 0402 10/12/20 0804  BP:  124/61 113/67 128/66  Pulse:  93 74 70  Resp:  18 18 18   Temp:  (!) 100.7 F (38.2 C) 98.2 F (36.8 C) 97.7 F (36.5 C)  TempSrc:  Oral Oral Oral  SpO2: 94% 96% 95% 91%  Weight:       Height:        Intake/Output Summary (Last 24 hours) at 10/12/2020 0906 Last data filed at 10/12/2020 0400 Gross per 24 hour  Intake 240 ml  Output 350 ml  Net -110 ml   Filed Weights   10/04/20 2024  Weight: 51.4 kg    Examination: General: Alert and oriented x2, less fatigued HEENT: Normocephalic and atraumatic, mucous membranes are slightly dry Neck: Supple, no JVD Cardiovascular: Regular rate and rhythm, S1-S2 Lungs: Decreased breath sounds bibasilar Abdomen: Soft, nontender, nondistended, hypoactive bowel sounds Extremities: No clubbing or cyanosis or edema Neuro: No focal deficits Psychiatry: Patient is appropriate, no evidence of psychoses  Data Reviewed: I have personally reviewed following labs and imaging studies  CBC: Recent Labs  Lab 10/06/20 0430 10/07/20 0333 10/08/20 0408 10/09/20 0418  WBC 5.1 3.8* 6.4 8.6  NEUTROABS 4.4 2.8 5.2 7.4  HGB 10.1* 10.9* 10.6* 10.9*  HCT 28.9* 32.1* 31.6* 32.0*  MCV 92.6 94.4 92.9 93.0  PLT 178 173 177 148*   Basic Metabolic Panel: Recent Labs  Lab 10/06/20 0430 10/07/20 0333 10/08/20 0408 10/09/20 0418 10/10/20 0354 10/11/20 0431  NA 138 138 137 136 137 135  K 4.1 4.6 4.1 4.8 4.1 4.5  CL 107 104 103 102 104 106  CO2 24 25 24 26 22  21*  GLUCOSE 143* 185* 168* 181* 162* 176*  BUN 32* 33* 33* 33* 45* 46*  CREATININE 1.33* 1.28* 1.17* 1.22* 1.24* 1.13*  CALCIUM 8.0* 8.2* 8.4* 8.5* 8.5* 8.5*  MG 2.0 2.0 1.8 1.8  --   --   PHOS 2.6 3.4 3.4 4.1  --   --    GFR: Estimated Creatinine Clearance: 29 mL/min (A) (by C-G formula based on SCr of 1.13 mg/dL (H)). Liver Function Tests: Recent Labs  Lab 10/06/20 0430 10/07/20 0333 10/08/20 0408 10/09/20 0418 10/10/20 0354  AST 39 79* 97* 76* 88*  ALT 24 52* 77* 72* 76*  ALKPHOS 33* 37* 35* 36* 37*  BILITOT 0.6 0.4 0.5 0.6 0.8  PROT 5.5* 5.9* 5.9* 6.0* 6.1*  ALBUMIN 2.4* 2.5* 2.5* 2.5* 2.5*   No results for input(s): LIPASE, AMYLASE in the last 168 hours. No  results for input(s): AMMONIA in the last 168 hours. Coagulation Profile: No results for input(s): INR, PROTIME in the last 168 hours. Cardiac Enzymes: No results for input(s): CKTOTAL, CKMB, CKMBINDEX, TROPONINI in the last 168 hours. BNP (last 3 results) No results for input(s): PROBNP in the last 8760 hours. HbA1C: No results for input(s): HGBA1C in the last 72 hours. CBG: No results for input(s): GLUCAP in the last 168 hours. Lipid Profile: No results for input(s): CHOL, HDL, LDLCALC, TRIG, CHOLHDL, LDLDIRECT in the last 72 hours. Thyroid Function Tests: No results for input(s): TSH, T4TOTAL, FREET4, T3FREE, THYROIDAB in the last 72 hours. Anemia Panel: No results for input(s): VITAMINB12, FOLATE, FERRITIN, TIBC, IRON, RETICCTPCT in the last 72 hours. Sepsis Labs: Recent Labs  Lab 10/07/20 0333 10/08/20 0408 10/10/20 0354 10/12/20 0549  PROCALCITON 0.68 0.43 0.59 0.34    Recent Results (from the past 240 hour(s))  Urine culture     Status: Abnormal   Collection Time: 10/04/20  2:47 PM   Specimen: In/Out Cath Urine  Result Value Ref Range Status   Specimen Description   Final    IN/OUT CATH URINE Performed at [facility], [address] [place], [place] [address]    Special Requests   Final    NONE Performed at [facility], [address] [place], [place] [address]    Culture (A)  Final    4,000 COLONIES/mL STAPHYLOCOCCUS EPIDERMIDIS 5,000 COLONIES/mL VIRIDANS STREPTOCOCCUS Standardized susceptibility testing for this organism is not available. Performed at [facility], [address] [place], [place] [address]    Report Status 10/08/2020 FINAL  Final   Organism ID, Bacteria STAPHYLOCOCCUS EPIDERMIDIS (A)  Final      Susceptibility   Staphylococcus epidermidis - MIC*    CIPROFLOXACIN >=8 RESISTANT Resistant     GENTAMICIN <=0.5 SENSITIVE Sensitive     NITROFURANTOIN <=16 SENSITIVE Sensitive     OXACILLIN <=0.25 SENSITIVE  Sensitive     TETRACYCLINE <=1 SENSITIVE Sensitive     VANCOMYCIN 1 SENSITIVE Sensitive     TRIMETH/SULFA <=10 SENSITIVE Sensitive     CLINDAMYCIN RESISTANT Resistant     RIFAMPIN <=0.5 SENSITIVE Sensitive     Inducible Clindamycin POSITIVE Resistant     * 4,000 COLONIES/mL STAPHYLOCOCCUS EPIDERMIDIS  Blood culture (routine single)     Status: None   Collection Time: 10/04/20  8:36 PM   Specimen: BLOOD  Result Value Ref Range Status   Specimen Description BLOOD LEFT ANTECUBITAL  Final   Special Requests   Final    BOTTLES DRAWN AEROBIC AND ANAEROBIC Blood Culture adequate volume   Culture   Final    NO GROWTH 5 DAYS Performed at [facility], [address] [place], [place] [address]    Report Status 10/09/2020 FINAL  Final  Radiology Studies: NM Pulmonary Perfusion  Result Date: 10/10/2020 CLINICAL DATA:  Respiratory failure EXAM: NUCLEAR MEDICINE PERFUSION LUNG SCAN TECHNIQUE: Perfusion images were obtained in multiple projections after intravenous injection of radiopharmaceutical. Ventilation scans intentionally deferred if perfusion scan and chest x-ray adequate for interpretation during COVID 19 epidemic. RADIOPHARMACEUTICALS:  4.52 mCi Tc-58m MAA IV COMPARISON:  Chest x-ray 10/09/2020 FINDINGS: Examination is limited as lateral acquisitions were unable to be obtained secondary to patient condition. Within this limitation. No segmental perfusion defects are identified. IMPRESSION: 1. Limited exam. 2. Low probability for pulmonary embolism. Electronically Signed   By: Duanne Guess D.O.   On: 10/10/2020 12:36        Scheduled Meds: . dexamethasone  6 mg Oral Q24H  . enoxaparin (LOVENOX) injection  30 mg Subcutaneous Q24H  . feeding supplement  237 mL Oral TID BM  . Ipratropium-Albuterol  1 puff Inhalation Q6H  . mirabegron ER  50 mg Oral Daily  . multivitamin with minerals  1 tablet Oral Daily  . pantoprazole  40 mg Oral Daily   Continuous  Infusions: . ceFEPime (MAXIPIME) IV Stopped (10/11/20 1040)  . vancomycin 500 mg (10/11/20 1112)    Assessment & Plan:   Active Problems:   GERD (gastroesophageal reflux disease)   HLD (hyperlipidemia)   Hypertension   Osteoporosis, post-menopausal   OAB (overactive bladder)   Acute hypoxemic respiratory failure due to COVID-19 [facility])   Anxiety   Chronic right lower quadrant pain   CRI (chronic renal insufficiency), stage 3 (moderate) (HCC)   Protein-calorie malnutrition, severe    Severe sepsis, present on admission secondary to bacterial pneumonia and COVID causing acute respiratory failure with hypoxia, present on admission: Patient meets criteria for severe sepsis on admission given lactic acidosis, tachypnea, tachycardia.  Sepsis episode stabilized.  Breathing initially improved, but then worsened, requiring high flow oxygen.  Given rising CRP and ferritin levels minimally changed from previous, I am concerned about worsening COVID, especially in lieu of only getting steroids and not Remdisivir.  Reaffirmed with daughter that they refused Remdisivir.  Chest x-ray more consistent with worsening COVID.  CRP equivocal.  VQ scan ruled out PE.  Procalcitonin minimally elevated broad-spectrum antibiotics added.  Procalcitonin level improving.  Oxygenation also improving.  Hopefully this trend will continue.  She still quite deconditioned and may end up needing skilled nursing.  Patient's daughter is trying to convince the patient of this.  #seizure like episode-at home Has hx/o seizures a decade ago .was on Dilantin and weaned off years ago.  Neurology's input was appreciated-clinical events certainly could be seizure, in this case provoked by infection and treatment with fluoroquinolone No indication for EEG, she already has history of epilepsy No indication to start AED capitalize for single provoked seizure at this time.  If she were to have a second event we will consider starting AED  at that time Patient counseled not to drive for at least 6 months after last seizure per [place] law If she has any further events she may seek referral to neurology from her outpatient PCP 5/31 MRI of brain was obtained for any new pathology which was negative for any acute abnormality  No seizure activity while hospitalized  Neurology signed off    Elevated LFT- likely from covid.  Continuing to monitor.  Not on Remdisivir and statin was discontinued.  Bilateral upper quadrant ultrasound negative.  GI consulted who felt that this could be likely from infection.  Viral hepatitis panel negative.   #AKI  on CKD 3b Likely prerenal.  Improved with IV fluids and creatinine down to 1.2 however GFR at 43.  We gave her IV fluids, will she likely will third space due to malnutrition.  Severe protein calorie malnutrition: Nutrition Problem: Severe Malnutrition Etiology: chronic illness Signs/Symptoms: moderate fat depletion,severe fat depletion,severe muscle depletion Interventions: Ensure Enlive (each supplement provides 350kcal and 20 grams of protein),Magic cup,MVI   #Elevated troponin-minimal Likely demand ischemia No chest pain  Hypertension-bp low to nml Continue to hold home bp meds.  Osteoporosis-hold alendronate/supplements to reduce trips in and out of room  Hyperlipidemia-continue statin  OAB-continue mirabegron  GERD-continue PPI   DVT prophylaxis: Lovenox Code Status: Full Family Communication: Updated daughter by phone  Status is: Inpatient  Remains inpatient appropriate because:Inpatient level of care appropriate due to severity of illness   Dispo: The patient is from: Home              Anticipated d/c is to: Home              Patient currently is not medically stable to d/c.   Difficult to place patient No  Hopefully patient will fully recovered.  Discharge once she is more stabilized and minimized on O2   LOS: 8 days     Hollice EspySendil K Elleen Coulibaly, MD [facility] Pager [phone]  If 7PM-7AM, please contact night-coverage 10/12/2020, 9:06 AM

## 2020-10-12 NOTE — Progress Notes (Addendum)
Occupational Therapy Treatment Patient Details Name: April Weaver MRN: [id number] DOB: [birth year] Today's Date: 10/12/2020    History of present illness presented to ER secondary to possible seizure activity; admitted for management of severe sepsis, acute hypoxic respiratory failure related to COVID-19.   OT comments  Pt seen for OT tx this date to f/u re: safety with ADLs/ADL mobility. Pt does not feel she has enough energy to walk much this date and c/o dizziness with STS trials. Pt agreeable to participating in seated and some light standing exercise and OT facilitates education re: safety sequencing of hand placement with 2WW including demonstration. Pt engaged in below-listed exercise with ~2-3 minute seated rest breaks between each to recover. Pt on 7L HFNC throughout session, sats 89-91% at rest and 86-88% with activity, requiring ~20 seconds seated rest to recover. Pt requires MIN A for ADL transfers this date with RW for UE support. She is noted to have somewhat declined with her functional activity tolerance at this time. Will continue to follow acutely, but since pt is requiring increased assist for ADL transfer safety and demos decreased fxl tolerance, anticipate her d/c recommendation will need to be updated to STR in SNF setting. Pt left in recliner with all needs met and in reach including table and call button. Still on 7Lnc with VSS.    Follow Up Recommendations  SNF    Equipment Recommendations  3 in 1 bedside commode;Tub/shower seat    Recommendations for Other Services      Precautions / Restrictions Precautions Precautions: Fall Restrictions Weight Bearing Restrictions: No       Mobility Bed Mobility               General bed mobility comments: up to chair pre/post session    Transfers Overall transfer level: Needs assistance Equipment used: Rolling walker (2 wheeled) Transfers: Sit to/from Stand Sit to Stand: Min assist         General  transfer comment: increased time and effort. more agreeable to walker use and generally requiring incerased UE support    Balance Overall balance assessment: Needs assistance Sitting-balance support: No upper extremity supported;Feet supported Sitting balance-Leahy Scale: Good     Standing balance support: Bilateral upper extremity supported Standing balance-Leahy Scale: Fair Standing balance comment: requires UE support to sustain static stand w/o risk of falling/swaying, generally decreased balance and standing tolerance with gradual decline                           ADL either performed or assessed with clinical judgement   ADL                                               Vision Patient Visual Report: No change from baseline     Perception     Praxis      Cognition Arousal/Alertness: Awake/alert Behavior During Therapy: Flat affect Overall Cognitive Status: Within Functional Limits for tasks assessed                                 General Comments: slightly increased processing time versus previuos sessions, but appropriate and oriented throughout.        Exercises Other Exercises Other Exercises: OT engages pt in seated ankle pumps  and SLR x10 to increase circulation in prep for mobilizing and practicing transfers. In standing, OT enages pt in 10 reps alternating MIP. Pt with sats gradually decreasing from 89-91% on 7L at rest to 86-88% on 7L with standing activity trials. Recovers in ~20-30 seconds of seated rest break. Other Exercises: OT educates pt re: safe sequencing of hand placement with sit<>stand with 2WW.   Shoulder Instructions       General Comments      Pertinent Vitals/ Pain       Pain Assessment: No/denies pain  Home Living                                          Prior Functioning/Environment              Frequency  Min 2X/week        Progress Toward Goals  OT  Goals(current goals can now be found in the care plan section)  Progress towards OT goals: OT to reassess next treatment (pt with some gradual decline with activity tolerance over course of this week's hospital stay. Will continue to monitor for progress, but pt actually requiring increased assist for ADL transfers this date.)  Acute Rehab OT Goals Patient Stated Goal: to return home OT Goal Formulation: With patient Time For Goal Achievement: 10/21/20 Potential to Achieve Goals: Good  Plan Discharge plan needs to be updated;Frequency needs to be updated    Co-evaluation                 AM-PAC OT "6 Clicks" Daily Activity     Outcome Measure   Help from another person eating meals?: None Help from another person taking care of personal grooming?: A Little Help from another person toileting, which includes using toliet, bedpan, or urinal?: A Little Help from another person bathing (including washing, rinsing, drying)?: A Little Help from another person to put on and taking off regular upper body clothing?: A Little Help from another person to put on and taking off regular lower body clothing?: A Little 6 Click Score: 19    End of Session Equipment Utilized During Treatment: Rolling walker;Oxygen  OT Visit Diagnosis: Unsteadiness on feet (R26.81);Muscle weakness (generalized) (M62.81)   Activity Tolerance Patient tolerated treatment well   Patient Left with call bell/phone within reach;in chair   Nurse Communication          Time: 0923-3007 OT Time Calculation (min): 23 min  Charges: OT General Charges $OT Visit: 1 Visit OT Treatments $Therapeutic Activity: 8-22 mins $Therapeutic Exercise: 8-22 mins  Gerrianne Scale, [place], OTR/L ascom [phone] 10/12/20, 2:48 PM

## 2020-10-12 NOTE — Consult Note (Signed)
Pharmacy Antibiotic Note  April Weaver is a 87 y.o. female w/ h/o HTN, HLD, OAB, anxiety, CKD admitted on 10/04/2020 after possible seizure-like episode ISO sepsis with recent covid diagnosis and c/f superimposed bacterial pneumonia.    Pharmacy has been consulted for vancomycin & cefepime dosing.  Plan:  Change Cefepime to 2g q12h per improvement CrCl to > 30 ml/min  Continue Vancomycin 500mg  q24 pending MRSA PCR Swab results   Goal AUC 400-550.  Expected AUC: 438  SCr used: 1.05  Height: 5\' 3"  (160 cm) Weight: 51.4 kg (113 lb 5.1 oz) IBW/kg (Calculated) : 52.4  Temp (24hrs), Avg:98.6 F (37 C), Min:97.7 F (36.5 C), Max:100.7 F (38.2 C)  Recent Labs  Lab 10/06/20 0430 10/07/20 0333 10/08/20 0408 10/09/20 0418 10/10/20 0354 10/11/20 0431 10/12/20 0549  WBC 5.1 3.8* 6.4 8.6  --   --   --   CREATININE 1.33* 1.28* 1.17* 1.22* 1.24* 1.13* 1.05*    Estimated Creatinine Clearance: 31.2 mL/min (A) (by C-G formula based on SCr of 1.05 mg/dL (H)).    Allergies  Allergen Reactions  . Celecoxib Other (See Comments)    Shakiness  . Metronidazole Nausea Only  . Typhoid Vaccines Swelling  . Penicillins Rash    Antimicrobials this admission: Vanc (6/2 >>  CFP (5/28 >>   Dose adjustments this admission: Borderline for adjustment if Scr improves  Microbiology results: 5/27 BCx: (single) NGTD 5/27 UCx: 4k CFU staph Epi (CPX, Clinda -R) & 5k CFU VGS  6/2 MRSA PCR: ordered  Thank you for allowing pharmacy to be a part of this patient's care.  6/27, PharmD, BCPS Clinical Pharmacist 10/12/2020 12:12 PM

## 2020-10-12 NOTE — Progress Notes (Signed)
PHARMACIST - PHYSICIAN COMMUNICATION  CONCERNING:  Enoxaparin (Lovenox) for DVT Prophylaxis    RECOMMENDATION: Patient was prescribed enoxaprin 30mg  q24 hours for VTE prophylaxis.   Filed Weights   10/04/20 2024  Weight: 51.4 kg (113 lb 5.1 oz)    Body mass index is 20.07 kg/m.  Estimated Creatinine Clearance: 31.2 mL/min (A) (by C-G formula based on SCr of 1.05 mg/dL (H)).  Patient is candidate for enoxaparin 40mg  every 24 hours based on CrCl >68ml/min  DESCRIPTION: Pharmacy has adjusted enoxaparin dose per [facility] policy.  Patient is now receiving enoxaparin 40 mg every 24 hours   31m, PharmD, BCPS Clinical Pharmacist 10/12/2020 12:15 PM

## 2020-10-13 DIAGNOSIS — A4189 Other specified sepsis: Principal | ICD-10-CM

## 2020-10-13 DIAGNOSIS — E43 Unspecified severe protein-calorie malnutrition: Secondary | ICD-10-CM

## 2020-10-13 LAB — BASIC METABOLIC PANEL
Anion gap: 9 (ref 5–15)
BUN: 52 mg/dL — ABNORMAL HIGH (ref 8–23)
CO2: 19 mmol/L — ABNORMAL LOW (ref 22–32)
Calcium: 8.7 mg/dL — ABNORMAL LOW (ref 8.9–10.3)
Chloride: 109 mmol/L (ref 98–111)
Creatinine, Ser: 1.11 mg/dL — ABNORMAL HIGH (ref 0.44–1.00)
GFR, Estimated: 48 mL/min — ABNORMAL LOW (ref >60)
Glucose, Bld: 160 mg/dL — ABNORMAL HIGH (ref 70–99)
Potassium: 4.4 mmol/L (ref 3.5–5.1)
Sodium: 137 mmol/L (ref 135–145)

## 2020-10-13 LAB — GLUCOSE, CAPILLARY: Glucose-Capillary: 110 mg/dL — ABNORMAL HIGH (ref 70–99)

## 2020-10-13 LAB — C-REACTIVE PROTEIN: CRP: 6 mg/dL — ABNORMAL HIGH (ref <1.0)

## 2020-10-13 NOTE — Progress Notes (Signed)
   10/13/20 0533 10/13/20 0646  Assess: MEWS Score  Temp 98.1 F (36.7 C)  --   BP 118/63  --   Pulse Rate 73  --   Resp 16  --   SpO2 96 %  --   O2 Device HFNC  --   O2 Flow Rate (L/min) 6 L/min 5 L/min  Pt has been weaned down to 5 L and sating 96%

## 2020-10-13 NOTE — Progress Notes (Signed)
PROGRESS NOTE    April Weaver  TIW:[id number] DOB: 1931-08-02 DOA: 10/04/2020 PCP: Lorenso Quarry, NP    Brief Narrative:  April Weaver is a 87 y.o. female with history of hypertension, hyperlipidemia, osteoporosis, overactive bladder, anxiety, chronic right lower quadrant pain, CKD, who presented to the emergency room on 5/27 after possible seizure episode was noticed by family at home.Patient was recently diagnosed on May 24 with COVID.  She was prescribed Levaquin, prednisone, and symptomatic medications.  No further seizures in the emergency room.  Admitted to the hospitalist service.  Noted to be hypoxic requiring 2 to 3 L of oxygen by nasal cannula.  LFTs also noted to be elevated, so GI consulted.  Procalcitonin also mildly elevated and patient put on cefepime.  Patient treated with steroids.  Patient and daughter have refused Remdisivir over concerns of renal failure.  Patient was showing signs of improvement with oxygenation improving, however on night of 5/31, oxygenation worsened and patient currently on 7 L high flow nasal cannula.  V/Q ruled out PE.  Procalcitonin minimally elevated with CRP which previously had been trending downward starting to come back up.  Vancomycin and cefepime added.  By 6/2, patient requiring 10 L high flow nasal cannula.    For the past few days, patient slowly improving in terms of oxygen needs, with oxygen down to 5 L today.  CRP which peaked as high as 8.4 on 6/3 down to 6.0 today.  Pt feeling ok, appetite better today.  Consultants:  neurology  Procedures:  VQ scan 6/2: Low probability of pulmonary embolus  Antimicrobials:   IV cefepime 6/2-present  IV vancomycin 6/2-present    Objective: Vitals:   10/13/20 0533 10/13/20 0600 10/13/20 0646 10/13/20 0747  BP: 118/63   123/72  Pulse: 73 70 78 74  Resp: 16   16  Temp: 98.1 F (36.7 C)   97.9 F (36.6 C)  TempSrc: Oral     SpO2: 96% 98% 96% 94%  Weight:      Height:         Intake/Output Summary (Last 24 hours) at 10/13/2020 1100 Last data filed at 10/13/2020 0801 Gross per 24 hour  Intake 77.72 ml  Output 800 ml  Net -722.28 ml   Filed Weights   10/04/20 2024  Weight: 51.4 kg    Examination: General: Alert and oriented x2, no acute distress HEENT: Normocephalic and atraumatic, mucous membranes are slightly dry Neck: Supple, no JVD Cardiovascular: Regular rate and rhythm, S1-S2 Lungs: Clear to auscultation bilaterally Abdomen: Soft, nontender, nondistended, hypoactive bowel sounds Extremities: No clubbing or cyanosis or edema Neuro: No focal deficits Psychiatry: Patient is appropriate, no evidence of psychoses  Data Reviewed: I have personally reviewed following labs and imaging studies  CBC: Recent Labs  Lab 10/07/20 0333 10/08/20 0408 10/09/20 0418  WBC 3.8* 6.4 8.6  NEUTROABS 2.8 5.2 7.4  HGB 10.9* 10.6* 10.9*  HCT 32.1* 31.6* 32.0*  MCV 94.4 92.9 93.0  PLT 173 177 148*   Basic Metabolic Panel: Recent Labs  Lab 10/07/20 0333 10/08/20 0408 10/09/20 0418 10/10/20 0354 10/11/20 0431 10/12/20 0549 10/13/20 0534  NA 138 137 136 137 135  --  137  K 4.6 4.1 4.8 4.1 4.5  --  4.4  CL 104 103 102 104 106  --  109  CO2 25 24 26 22  21*  --  19*  GLUCOSE 185* 168* 181* 162* 176*  --  160*  BUN 33* 33* 33* 45* 46*  --  52*  CREATININE 1.28* 1.17* 1.22* 1.24* 1.13* 1.05* 1.11*  CALCIUM 8.2* 8.4* 8.5* 8.5* 8.5*  --  8.7*  MG 2.0 1.8 1.8  --   --   --   --   PHOS 3.4 3.4 4.1  --   --   --   --    GFR: Estimated Creatinine Clearance: 29.5 mL/min (A) (by C-G formula based on SCr of 1.11 mg/dL (H)). Liver Function Tests: Recent Labs  Lab 10/07/20 0333 10/08/20 0408 10/09/20 0418 10/10/20 0354  AST 79* 97* 76* 88*  ALT 52* 77* 72* 76*  ALKPHOS 37* 35* 36* 37*  BILITOT 0.4 0.5 0.6 0.8  PROT 5.9* 5.9* 6.0* 6.1*  ALBUMIN 2.5* 2.5* 2.5* 2.5*   No results for input(s): LIPASE, AMYLASE in the last 168 hours. No results for input(s):  AMMONIA in the last 168 hours. Coagulation Profile: No results for input(s): INR, PROTIME in the last 168 hours. Cardiac Enzymes: No results for input(s): CKTOTAL, CKMB, CKMBINDEX, TROPONINI in the last 168 hours. BNP (last 3 results) No results for input(s): PROBNP in the last 8760 hours. HbA1C: No results for input(s): HGBA1C in the last 72 hours. CBG: No results for input(s): GLUCAP in the last 168 hours. Lipid Profile: No results for input(s): CHOL, HDL, LDLCALC, TRIG, CHOLHDL, LDLDIRECT in the last 72 hours. Thyroid Function Tests: No results for input(s): TSH, T4TOTAL, FREET4, T3FREE, THYROIDAB in the last 72 hours. Anemia Panel: No results for input(s): VITAMINB12, FOLATE, FERRITIN, TIBC, IRON, RETICCTPCT in the last 72 hours. Sepsis Labs: Recent Labs  Lab 10/07/20 0333 10/08/20 0408 10/10/20 0354 10/12/20 0549  PROCALCITON 0.68 0.43 0.59 0.34    Recent Results (from the past 240 hour(s))  Urine culture     Status: Abnormal   Collection Time: 10/04/20  2:47 PM   Specimen: In/Out Cath Urine  Result Value Ref Range Status   Specimen Description   Final    IN/OUT CATH URINE Performed at [facility], [address] [place], [place] [address]    Special Requests   Final    NONE Performed at [facility], [address] [place], [place] [address]    Culture (A)  Final    4,000 COLONIES/mL STAPHYLOCOCCUS EPIDERMIDIS 5,000 COLONIES/mL VIRIDANS STREPTOCOCCUS Standardized susceptibility testing for this organism is not available. Performed at [facility], [address] [place], [place] [address]    Report Status 10/08/2020 FINAL  Final   Organism ID, Bacteria STAPHYLOCOCCUS EPIDERMIDIS (A)  Final      Susceptibility   Staphylococcus epidermidis - MIC*    CIPROFLOXACIN >=8 RESISTANT Resistant     GENTAMICIN <=0.5 SENSITIVE Sensitive     NITROFURANTOIN <=16 SENSITIVE Sensitive     OXACILLIN <=0.25 SENSITIVE Sensitive     TETRACYCLINE  <=1 SENSITIVE Sensitive     VANCOMYCIN 1 SENSITIVE Sensitive     TRIMETH/SULFA <=10 SENSITIVE Sensitive     CLINDAMYCIN RESISTANT Resistant     RIFAMPIN <=0.5 SENSITIVE Sensitive     Inducible Clindamycin POSITIVE Resistant     * 4,000 COLONIES/mL STAPHYLOCOCCUS EPIDERMIDIS  Blood culture (routine single)     Status: None   Collection Time: 10/04/20  8:36 PM   Specimen: BLOOD  Result Value Ref Range Status   Specimen Description BLOOD LEFT ANTECUBITAL  Final   Special Requests   Final    BOTTLES DRAWN AEROBIC AND ANAEROBIC Blood Culture adequate volume   Culture   Final    NO GROWTH 5  DAYS Performed at [facility], [address] [place], [place] [address]    Report Status 10/09/2020 FINAL  Final  MRSA PCR Screening     Status: None   Collection Time: 10/10/20 10:18 AM   Specimen: Nasal Mucosa; Nasopharyngeal  Result Value Ref Range Status   MRSA by PCR NEGATIVE NEGATIVE Final    Comment:        The GeneXpert MRSA Assay (FDA approved for NASAL specimens only), is one component of a comprehensive MRSA colonization surveillance program. It is not intended to diagnose MRSA infection nor to guide or monitor treatment for MRSA infections. Performed at [facility], [address] [place], [place] [address]          Radiology Studies: No results found.      Scheduled Meds: . dexamethasone  6 mg Oral Q24H  . enoxaparin (LOVENOX) injection  40 mg Subcutaneous Q24H  . feeding supplement  237 mL Oral TID BM  . Ipratropium-Albuterol  1 puff Inhalation Q6H  . mirabegron ER  50 mg Oral Daily  . multivitamin with minerals  1 tablet Oral Daily  . pantoprazole  40 mg Oral Daily  . polyethylene glycol  17 g Oral Daily   Continuous Infusions: . ceFEPime (MAXIPIME) IV 2 g (10/13/20 0929)    Assessment & Plan:   Active Problems:   GERD (gastroesophageal reflux disease)   HLD (hyperlipidemia)   Hypertension   Osteoporosis, post-menopausal   OAB  (overactive bladder)   Acute hypoxemic respiratory failure due to COVID-19 [facility](HCC)   Anxiety   Chronic right lower quadrant pain   CRI (chronic renal insufficiency), stage 3 (moderate) (HCC)   Protein-calorie malnutrition, severe    Severe sepsis, present on admission secondary to bacterial pneumonia and COVID causing acute respiratory failure with hypoxia, present on admission: Patient meets criteria for severe sepsis on admission given lactic acidosis, tachypnea, tachycardia.  Sepsis episode stabilized.  Breathing initially improved, but then worsened, requiring high flow oxygen.  Given rising CRP and ferritin levels minimally changed from previous, I am concerned about worsening COVID, especially in lieu of only getting steroids and not Remdisivir.  Reaffirmed with daughter that they refused Remdisivir.  Chest x-ray more consistent with worsening COVID.  CRP equivocal.  VQ scan ruled out PE.  Procalcitonin minimally elevated broad-spectrum antibiotics added.  Procalcitonin, oxygenation and CRP continue to improve.  Quite deconditioned and PT and OT are recommending skilled nursing.  Patient is opposed to this, despite family wishes so so plan will be for patient to go home with home health  #seizure like episode-at home Has hx/o seizures a decade ago .was on Dilantin and weaned off years ago.  Neurology's input was appreciated-clinical events certainly could be seizure, in this case provoked by infection and treatment with fluoroquinolone No indication for EEG, she already has history of epilepsy No indication to start AED capitalize for single provoked seizure at this time.  If she were to have a second event we will consider starting AED at that time Patient counseled not to drive for at least 6 months after last seizure per [place] law If she has any further events she may seek referral to neurology from her outpatient PCP 5/31 MRI of brain was obtained for any new pathology which was negative for  any acute abnormality  No seizure activity while hospitalized  Neurology signed off    Elevated LFT- likely from covid.  Continuing to monitor.  Not on Remdisivir and statin was  discontinued.  Bilateral upper quadrant ultrasound negative.  GI consulted who felt that this could be likely from infection.  Viral hepatitis panel negative.  Recheck labs in the morning.   #AKI on CKD 3b Likely prerenal.  Improved with IV fluids and creatinine down to 1.2 however GFR at 43.  We gave her IV fluids, will she likely will third space due to malnutrition.  Staying stable currently.  Severe protein calorie malnutrition: Nutrition Problem: Severe Malnutrition Etiology: chronic illness Signs/Symptoms: moderate fat depletion,severe fat depletion,severe muscle depletion Interventions: Ensure Enlive (each supplement provides 350kcal and 20 grams of protein),Magic cup,MVI   #Elevated troponin-minimal Likely demand ischemia No chest pain  Hypertension- continues to remain within normal limits despite not of her oral medications. Continue to hold home bp meds.  Osteoporosis-hold alendronate/supplements to reduce trips in and out of room  Hyperlipidemia-continue statin  OAB-continue mirabegron  GERD-continue PPI   DVT prophylaxis: Lovenox Code Status: Full Family Communication: Updated daughter by phone  Status is: Inpatient  Remains inpatient appropriate because:Inpatient level of care appropriate due to severity of illness   Dispo: The patient is from: Home              Anticipated d/c is to: Home              Patient currently is not medically stable to d/c.   Difficult to place patient No  Hopefully patient will be fully recovered.  Discharge once she is more stabilized and minimized on O2, likely later this week   LOS: 9 days     Hollice Espy, MD [facility] Pager [phone]  If 7PM-7AM, please contact night-coverage 10/13/2020, 11:00 AM

## 2020-10-13 NOTE — TOC Progression Note (Signed)
Transition of Care [facility]) - Progression Note    Patient Details  Name: April Weaver MRN: [id number] Date of Birth: [birth year]  [facility]) CM/SW Contact  April Royalty Lutricia Feil, RN Phone Number:[phone] 10/13/2020, 5:21 PM  Clinical Narrative:    Recommended for PT/OT SNF however daughter April Weaver) has indicated pt will not have SNF and will be discharge to her home with supportive family. Daughter remains receptive to [facility] for the previous acceptance of PT/OT/RN for services. Currently pt being weaned with her oxygen (5 liters N/C today) and may need home O2 prior to d/c with sat scores.  DME will be supplied by [employer] (April Weaver)for 3-1 and a rollator with a seat to be delivered to pt's room.  Will update the team and continue to be available for discharge needs.   Expected Discharge Plan: Home w Home Health Services Barriers to Discharge: Continued Medical Work up  Expected Discharge Plan and Services Expected Discharge Plan: Home w Home Health Services   Discharge Planning Services: CM Consult Post Acute Care Choice: Home Health Living arrangements for the past 2 months: Single Family Home                 DME Arranged: Oxygen DME Agency: [employer]       HH Arranged: RN,PT,OT HH Agency: [facility] Date HH Agency Contacted: 10/07/20 Time HH Agency Contacted: 1200 Representative spoke with at [facility] Agency: April Weaver   Social Determinants of Health (SDOH) Interventions    Readmission Risk Interventions No flowsheet data found.

## 2020-10-13 NOTE — Plan of Care (Signed)

## 2020-10-14 LAB — COMPREHENSIVE METABOLIC PANEL
ALT: 86 U/L — ABNORMAL HIGH (ref 0–44)
AST: 56 U/L — ABNORMAL HIGH (ref 15–41)
Albumin: 2.4 g/dL — ABNORMAL LOW (ref 3.5–5.0)
Alkaline Phosphatase: 39 U/L (ref 38–126)
Anion gap: 7 (ref 5–15)
BUN: 49 mg/dL — ABNORMAL HIGH (ref 8–23)
CO2: 20 mmol/L — ABNORMAL LOW (ref 22–32)
Calcium: 8.8 mg/dL — ABNORMAL LOW (ref 8.9–10.3)
Chloride: 110 mmol/L (ref 98–111)
Creatinine, Ser: 1.19 mg/dL — ABNORMAL HIGH (ref 0.44–1.00)
GFR, Estimated: 45 mL/min — ABNORMAL LOW (ref >60)
Glucose, Bld: 158 mg/dL — ABNORMAL HIGH (ref 70–99)
Potassium: 4.8 mmol/L (ref 3.5–5.1)
Sodium: 137 mmol/L (ref 135–145)
Total Bilirubin: 0.6 mg/dL (ref 0.3–1.2)
Total Protein: 6.1 g/dL — ABNORMAL LOW (ref 6.5–8.1)

## 2020-10-14 LAB — GLUCOSE, CAPILLARY: Glucose-Capillary: 139 mg/dL — ABNORMAL HIGH (ref 70–99)

## 2020-10-14 LAB — C-REACTIVE PROTEIN: CRP: 4.3 mg/dL — ABNORMAL HIGH (ref <1.0)

## 2020-10-14 MED ORDER — ENOXAPARIN SODIUM 30 MG/0.3ML IJ SOSY
30.0000 mg | PREFILLED_SYRINGE | INTRAMUSCULAR | Status: DC
  Administered 2020-10-14 – 2020-10-16 (×3): 30 mg via SUBCUTANEOUS
  Filled 2020-10-14 (×3): qty 0.3

## 2020-10-14 MED ORDER — CEFEPIME HCL 2 G IJ SOLR
2.0000 g | INTRAMUSCULAR | Status: AC
Start: 2020-10-14 — End: 2020-10-14
  Administered 2020-10-14: 2 g via INTRAVENOUS
  Filled 2020-10-14: qty 2

## 2020-10-14 NOTE — Progress Notes (Addendum)
Physical Therapy Treatment Patient Details Name: April Weaver MRN: [id number] DOB: 09/23/1931 Today's Date: 10/14/2020    History of Present Illness presented to ER secondary to possible seizure activity; admitted for management of severe sepsis, acute hypoxic respiratory failure related to COVID-19.    PT Comments    Pt up in chair, stated she has been walking to commode with nursing staff.  Stands and walks around bed with no AD but occasional hand held on bed for balance.  Stated she has a RW at home but does not use it except for a few days prior to admission.  She is resistant to use it during session and overall does well with occasional touch assist on bed.  Per TOC notes, family wishes to take pt home and is refusing SNF. Pt did demonstrate safe gait in room.  While distance is self selected and limited will update discharge disposition to HHPT with family assist.  She did not want further activity at this time "I don't want to overdo it." Stated daughter is an Charity fundraiser and feels comfortable with plan.  She stated she did not need any equipment as they have what is needed at home.     Follow Up Recommendations  Home health PT     Equipment Recommendations       Recommendations for Other Services       Precautions / Restrictions Precautions Precautions: Fall Restrictions Weight Bearing Restrictions: No    Mobility  Bed Mobility Overal bed mobility: Modified Independent                  Transfers Overall transfer level: Modified independent Equipment used: None Transfers: Sit to/from Stand Sit to Stand: Supervision            Ambulation/Gait Ambulation/Gait assistance: Supervision Gait Distance (Feet): 40 Feet Assistive device: None Gait Pattern/deviations: Step-through pattern Gait velocity: decreased   General Gait Details: holds bed with 1 UE for support - has walker at home if needed but resistant to use one here   Stairs              Wheelchair Mobility    Modified Rankin (Stroke Patients Only)       Balance Overall balance assessment: Needs assistance Sitting-balance support: No upper extremity supported;Feet supported Sitting balance-Leahy Scale: Good     Standing balance support: Single extremity supported Standing balance-Leahy Scale: Fair                              Cognition Arousal/Alertness: Awake/alert Behavior During Therapy: WFL for tasks assessed/performed Overall Cognitive Status: Within Functional Limits for tasks assessed                                        Exercises      General Comments        Pertinent Vitals/Pain Pain Assessment: No/denies pain    Home Living                      Prior Function            PT Goals (current goals can now be found in the care plan section)      Frequency    Min 2X/week      PT Plan Current plan remains appropriate    Co-evaluation  AM-PAC PT "6 Clicks" Mobility   Outcome Measure  Help needed turning from your back to your side while in a flat bed without using bedrails?: None Help needed moving from lying on your back to sitting on the side of a flat bed without using bedrails?: None Help needed moving to and from a bed to a chair (including a wheelchair)?: A Little Help needed standing up from a chair using your arms (e.g., wheelchair or bedside chair)?: A Little Help needed to walk in hospital room?: A Little Help needed climbing 3-5 steps with a railing? : A Lot 6 Click Score: 19    End of Session Equipment Utilized During Treatment: Gait belt;Oxygen Activity Tolerance: Patient limited by fatigue Patient left: in chair;with call bell/phone within reach Nurse Communication: Mobility status PT Visit Diagnosis: Muscle weakness (generalized) (M62.81);Difficulty in walking, not elsewhere classified (R26.2)     Time: 2376-2831 PT Time Calculation (min) (ACUTE  ONLY): 15 min  Charges:  $Gait Training: 8-22 mins                    Danielle Dess, PTA 10/14/20, 3:25 PM

## 2020-10-14 NOTE — Progress Notes (Signed)
PROGRESS NOTE    April Weaver  ZOX:[id number] DOB: 23-Jan-1932 DOA: 10/04/2020 PCP: Lorenso QuarryLeach, Shannon, NP    Brief Narrative:  April Weaver is a 87 y.o. female with history of hypertension, hyperlipidemia, osteoporosis, overactive bladder, anxiety, chronic right lower quadrant pain, CKD, who presented to the emergency room on 5/27 after possible seizure episode was noticed by family at home.Patient was recently diagnosed on May 24 with COVID.  She was prescribed Levaquin, prednisone, and symptomatic medications.  No further seizures in the emergency room.  Admitted to the hospitalist service.  Noted to be hypoxic requiring 2 to 3 L of oxygen by nasal cannula.  LFTs also noted to be elevated, so GI consulted.  Procalcitonin also mildly elevated and patient put on cefepime.  Patient treated with steroids.  Patient and daughter have refused Remdisivir over concerns of renal failure.  Patient was showing signs of improvement with oxygenation improving, however on night of 5/31, oxygenation worsened and patient currently on 7 L high flow nasal cannula.  V/Q ruled out PE.  Procalcitonin minimally elevated with CRP which previously had been trending downward starting to come back up.  Vancomycin and cefepime added.  By 6/2, patient requiring 10 L high flow nasal cannula.    Since then, patient has been steadily improving with oxygen needs trending downward, today down to 4 L.  CRP trending downward as well.   Consultants:  neurology  Procedures:  VQ scan 6/2: Low probability of pulmonary embolus  Antimicrobials:   IV cefepime 6/2-present  IV vancomycin 6/2-present    Objective: Vitals:   10/13/20 2107 10/14/20 0000 10/14/20 0459 10/14/20 0809  BP: 120/60 (!) 114/54 113/66 132/66  Pulse: 81 80 74 68  Resp: 18 16 18 18   Temp: 98.8 F (37.1 C) 98.4 F (36.9 C) 97.8 F (36.6 C) 98.4 F (36.9 C)  TempSrc:  Oral Oral   SpO2: 91% 96% 96% 92%  Weight:      Height:         Intake/Output Summary (Last 24 hours) at 10/14/2020 0912 Last data filed at 10/13/2020 1500 Gross per 24 hour  Intake 100 ml  Output --  Net 100 ml   Filed Weights   10/04/20 2024  Weight: 51.4 kg    Examination: General: Alert and oriented x2, no acute distress HEENT: Normocephalic and atraumatic, mucous membranes are slightly dry Neck: Supple, no JVD Cardiovascular: Regular rate and rhythm, S1-S2 Lungs: Clear to auscultation bilaterally Abdomen: Soft, nontender, nondistended, hypoactive bowel sounds Extremities: No clubbing or cyanosis or edema Neuro: No focal deficits Psychiatry: Patient is appropriate, no evidence of psychoses  Data Reviewed: I have personally reviewed following labs and imaging studies  CBC: Recent Labs  Lab 10/08/20 0408 10/09/20 0418  WBC 6.4 8.6  NEUTROABS 5.2 7.4  HGB 10.6* 10.9*  HCT 31.6* 32.0*  MCV 92.9 93.0  PLT 177 148*   Basic Metabolic Panel: Recent Labs  Lab 10/08/20 0408 10/09/20 0418 10/10/20 0354 10/11/20 0431 10/12/20 0549 10/13/20 0534 10/14/20 0632  NA 137 136 137 135  --  137 137  K 4.1 4.8 4.1 4.5  --  4.4 4.8  CL 103 102 104 106  --  109 110  CO2 24 26 22  21*  --  19* 20*  GLUCOSE 168* 181* 162* 176*  --  160* 158*  BUN 33* 33* 45* 46*  --  52* 49*  CREATININE 1.17* 1.22* 1.24* 1.13* 1.05* 1.11* 1.19*  CALCIUM 8.4* 8.5* 8.5* 8.5*  --  8.7* 8.8*  MG 1.8 1.8  --   --   --   --   --   PHOS 3.4 4.1  --   --   --   --   --    GFR: Estimated Creatinine Clearance: 27.5 mL/min (A) (by C-G formula based on SCr of 1.19 mg/dL (H)). Liver Function Tests: Recent Labs  Lab 10/08/20 0408 10/09/20 0418 10/10/20 0354 10/14/20 0632  AST 97* 76* 88* 56*  ALT 77* 72* 76* 86*  ALKPHOS 35* 36* 37* 39  BILITOT 0.5 0.6 0.8 0.6  PROT 5.9* 6.0* 6.1* 6.1*  ALBUMIN 2.5* 2.5* 2.5* 2.4*   No results for input(s): LIPASE, AMYLASE in the last 168 hours. No results for input(s): AMMONIA in the last 168 hours. Coagulation  Profile: No results for input(s): INR, PROTIME in the last 168 hours. Cardiac Enzymes: No results for input(s): CKTOTAL, CKMB, CKMBINDEX, TROPONINI in the last 168 hours. BNP (last 3 results) No results for input(s): PROBNP in the last 8760 hours. HbA1C: No results for input(s): HGBA1C in the last 72 hours. CBG: Recent Labs  Lab 10/13/20 2110 10/14/20 0808  GLUCAP 110* 139*   Lipid Profile: No results for input(s): CHOL, HDL, LDLCALC, TRIG, CHOLHDL, LDLDIRECT in the last 72 hours. Thyroid Function Tests: No results for input(s): TSH, T4TOTAL, FREET4, T3FREE, THYROIDAB in the last 72 hours. Anemia Panel: No results for input(s): VITAMINB12, FOLATE, FERRITIN, TIBC, IRON, RETICCTPCT in the last 72 hours. Sepsis Labs: Recent Labs  Lab 10/08/20 0408 10/10/20 0354 10/12/20 0549  PROCALCITON 0.43 0.59 0.34    Recent Results (from the past 240 hour(s))  Urine culture     Status: Abnormal   Collection Time: 10/04/20  2:47 PM   Specimen: In/Out Cath Urine  Result Value Ref Range Status   Specimen Description   Final    IN/OUT CATH URINE Performed at [facility], [address] [place], [place] [address]    Special Requests   Final    NONE Performed at [facility], [address] [place], [place] [address]    Culture (A)  Final    4,000 COLONIES/mL STAPHYLOCOCCUS EPIDERMIDIS 5,000 COLONIES/mL VIRIDANS STREPTOCOCCUS Standardized susceptibility testing for this organism is not available. Performed at [facility], [address], [place], [place] [address]    Report Status 10/08/2020 FINAL  Final   Organism ID, Bacteria STAPHYLOCOCCUS EPIDERMIDIS (A)  Final      Susceptibility   Staphylococcus epidermidis - MIC*    CIPROFLOXACIN >=8 RESISTANT Resistant     GENTAMICIN <=0.5 SENSITIVE Sensitive     NITROFURANTOIN <=16 SENSITIVE Sensitive     OXACILLIN <=0.25 SENSITIVE Sensitive     TETRACYCLINE <=1 SENSITIVE Sensitive     VANCOMYCIN 1 SENSITIVE  Sensitive     TRIMETH/SULFA <=10 SENSITIVE Sensitive     CLINDAMYCIN RESISTANT Resistant     RIFAMPIN <=0.5 SENSITIVE Sensitive     Inducible Clindamycin POSITIVE Resistant     * 4,000 COLONIES/mL STAPHYLOCOCCUS EPIDERMIDIS  Blood culture (routine single)     Status: None   Collection Time: 10/04/20  8:36 PM   Specimen: BLOOD  Result Value Ref Range Status   Specimen Description BLOOD LEFT ANTECUBITAL  Final   Special Requests   Final    BOTTLES DRAWN AEROBIC AND ANAEROBIC Blood Culture adequate volume   Culture   Final    NO GROWTH 5 DAYS Performed at [facility], [address] [place], [place] [address]  Report Status 10/09/2020 FINAL  Final  MRSA PCR Screening     Status: None   Collection Time: 10/10/20 10:18 AM   Specimen: Nasal Mucosa; Nasopharyngeal  Result Value Ref Range Status   MRSA by PCR NEGATIVE NEGATIVE Final    Comment:        The GeneXpert MRSA Assay (FDA approved for NASAL specimens only), is one component of a comprehensive MRSA colonization surveillance program. It is not intended to diagnose MRSA infection nor to guide or monitor treatment for MRSA infections. Performed at [facility], [address], [place], [place] [address]          Radiology Studies: No results found.      Scheduled Meds: . dexamethasone  6 mg Oral Q24H  . enoxaparin (LOVENOX) injection  30 mg Subcutaneous Q24H  . feeding supplement  237 mL Oral TID BM  . Ipratropium-Albuterol  1 puff Inhalation Q6H  . mirabegron ER  50 mg Oral Daily  . multivitamin with minerals  1 tablet Oral Daily  . pantoprazole  40 mg Oral Daily  . polyethylene glycol  17 g Oral Daily   Continuous Infusions: . ceFEPime (MAXIPIME) IV      Assessment & Plan:   Active Problems:   GERD (gastroesophageal reflux disease)   HLD (hyperlipidemia)   Hypertension   Osteoporosis, post-menopausal   OAB (overactive bladder)   Acute hypoxemic respiratory failure due to  COVID-19 [facility])   Anxiety   Chronic right lower quadrant pain   CRI (chronic renal insufficiency), stage 3 (moderate) (HCC)   Protein-calorie malnutrition, severe    Severe sepsis, present on admission secondary to bacterial pneumonia and COVID causing acute respiratory failure with hypoxia, present on admission: Patient meets criteria for severe sepsis on admission given lactic acidosis, tachypnea, tachycardia.  Sepsis episode stabilized.  Breathing initially improved, but then worsened, requiring high flow oxygen.  Given rising CRP and ferritin levels minimally changed from previous, I am concerned about worsening COVID, especially in lieu of only getting steroids and not Remdisivir.  Reaffirmed with daughter that they refused Remdisivir.  Chest x-ray more consistent with worsening COVID.  CRP equivocal.  VQ scan ruled out PE.  Procalcitonin minimally elevated broad-spectrum antibiotics added.  Procalcitonin, oxygenation and CRP continue to improve.  Quite deconditioned and PT and OT are recommending skilled nursing.  Patient is opposed to this, despite family wishes so so plan will be for patient to go home with home health  #seizure like episode-at home Has hx/o seizures a decade ago .was on Dilantin and weaned off years ago.  Neurology's input was appreciated-clinical events certainly could be seizure, in this case provoked by infection and treatment with fluoroquinolone No indication for EEG, she already has history of epilepsy No indication to start AED capitalize for single provoked seizure at this time.  If she were to have a second event we will consider starting AED at that time Patient counseled not to drive for at least 6 months after last seizure per [place] law If she has any further events she may seek referral to neurology from her outpatient PCP 5/31 MRI of brain was obtained for any new pathology which was negative for any acute abnormality  No seizure activity while hospitalized   Neurology signed off    Elevated LFT- likely from covid.  Continuing to monitor.  Not on Remdisivir and statin was discontinued.  Bilateral upper quadrant ultrasound negative.  GI consulted who felt that this could be likely from  infection.  Viral hepatitis panel negative.  AST improved while ALT slightly increased.  Equivocal.   #AKI on CKD 3b Likely prerenal.  Improved with IV fluids and creatinine down to 1.2 however GFR at 43.  We gave her IV fluids, will she likely will third space due to malnutrition.  Staying stable currently.  Severe protein calorie malnutrition: Nutrition Problem: Severe Malnutrition Etiology: chronic illness Signs/Symptoms: moderate fat depletion,severe fat depletion,severe muscle depletion Interventions: Ensure Enlive (each supplement provides 350kcal and 20 grams of protein),Magic cup,MVI Appreciate nutrition help.  #Elevated troponin-minimal Likely demand ischemia.  No chest pain  Hypertension- continues to remain within normal limits despite not of her oral medications.  Continue to hold home bp meds.  Osteoporosis-hold alendronate/supplements to reduce trips in and out of room  Hyperlipidemia-continue statin  OAB-continue mirabegron  GERD-continue PPI   DVT prophylaxis: Lovenox Code Status: Full Family Communication: Updated daughter by phone  Status is: Inpatient  Remains inpatient appropriate because:Inpatient level of care appropriate due to severity of illness   Dispo: The patient is from: Home              Anticipated d/c is to: Home              Patient currently is not medically stable to d/c.   Difficult to place patient No  Hopefully patient will be fully recovered.  Discharge once she is more stabilized and minimized on O2, likely later this week   LOS: 10 days     Hollice Espy, MD [facility] Pager [phone]  If 7PM-7AM, please contact night-coverage 10/14/2020, 9:12 AM

## 2020-10-14 NOTE — Progress Notes (Signed)
PHARMACIST - PHYSICIAN COMMUNICATION  CONCERNING:  Enoxaparin (Lovenox) for DVT Prophylaxis   DESCRIPTION: Patient was prescribed enoxaprin 40mg  q24 hours for VTE prophylaxis.   Filed Weights   10/04/20 2024  Weight: 51.4 kg (113 lb 5.1 oz)    Body mass index is 20.07 kg/m.  Estimated Creatinine Clearance: 27.5 mL/min (A) (by C-G formula based on SCr of 1.19 mg/dL (H)).   Patient is candidate for enoxaparin 30mg  every 24 hours based on CrCl <82ml/min or Weight <45kg  RECOMMENDATION: Pharmacy has adjusted enoxaparin dose per [facility] policy.  Patient is now receiving enoxaparin 30 mg every 24 hours   31m, PharmD Clinical Pharmacist  10/14/2020 1:11 PM

## 2020-10-15 NOTE — Progress Notes (Signed)
Occupational Therapy Treatment Patient Details Name: April Weaver MRN: [id number] DOB: 26-Jun-1931 Today's Date: 10/15/2020    History of present illness presented to ER secondary to possible seizure activity; admitted for management of severe sepsis, acute hypoxic respiratory failure related to COVID-19.   OT comments  Ms. Swaim was seen for OT treatment on this date. Upon arrival to room pt awake/alert, semi-supine in bed. Eager to participate in therapy session. Pt endorses working to stay as active as possible by using the bedside commode and engaging in mobility when she is able to with hospital staff. On this date, pt able to ambulate within room (double occupancy room) from EOB>door>around bed>chair (~50 feet) with CGA and cueing for safe use of RW. Pt moves slowly and requires occasional therapeutic rest break. Pt on 4L at start of session with SO2 >/= 94%. With functional activity SO2 drops to 84%. Pt increased to 6L O2 and SO2 rebounds to 90's. Pt requires prompting to utilizes PLB during session. Pt educated on safe use of AE/DME, energy conservation strategies, and falls prevention strategies t/o session. She is able to return demonstrate understanding of instruction provided with cueing. Pt making good progress toward goals and continues to benefit from skilled OT services to maximize return to PLOF and minimize risk of future falls, injury, caregiver burden, and readmission. Will continue to follow POC. Discharge recommendation updated to [facility] in consideration of pt progress/wishes.    Follow Up Recommendations  Home health OT    Equipment Recommendations  3 in 1 bedside commode;Tub/shower seat    Recommendations for Other Services      Precautions / Restrictions Precautions Precautions: Fall Restrictions Weight Bearing Restrictions: No       Mobility Bed Mobility Overal bed mobility: Modified Independent             General bed mobility comments: Pt comes to  sitting at EOB with use of bed rails.    Transfers Overall transfer level: Needs assistance Equipment used: Rolling walker (2 wheeled) Transfers: Sit to/from Stand Sit to Stand: Supervision              Balance Overall balance assessment: Needs assistance Sitting-balance support: No upper extremity supported;Feet supported Sitting balance-Leahy Scale: Good     Standing balance support: During functional activity;Single extremity supported Standing balance-Leahy Scale: Fair Standing balance comment: requires UE support to sustain static stand w/o risk of falling/swaying                           ADL either performed or assessed with clinical judgement   ADL Overall ADL's : Needs assistance/impaired                                       General ADL Comments: CGA for functional mobility in room with RW. Cueing for implementation of energy conservation strategies during functional activity this date. Pt motivated to engage in functional activity and learn about energy conservation.     Vision Patient Visual Report: No change from baseline     Perception     Praxis      Cognition Arousal/Alertness: Awake/alert Behavior During Therapy: WFL for tasks assessed/performed Overall Cognitive Status: Within Functional Limits for tasks assessed  Exercises Other Exercises Other Exercises: OT facilitates bed/functional mobility in room. Pt educated on safe use of AE/DME, energy conservation strategies, and falls prevention strategies t/o session.   Shoulder Instructions       General Comments Vitals monitored t/o session. Pt on 4L at start of session with SO2 >/= 94%. With functional activity SO2 drops to 84%. Pt increased to 6L O2 and SO2 rebounds to 90's. Pt requires prompting to utilizes PLB during session. Benefits from therapeutic rest breaks t/o session.    Pertinent Vitals/ Pain        Pain Assessment: No/denies pain  Home Living                                          Prior Functioning/Environment              Frequency  Min 2X/week        Progress Toward Goals  OT Goals(current goals can now be found in the care plan section)  Progress towards OT goals: Progressing toward goals  Acute Rehab OT Goals Patient Stated Goal: to return home OT Goal Formulation: With patient Time For Goal Achievement: 10/21/20 Potential to Achieve Goals: Good  Plan Frequency remains appropriate;Discharge plan needs to be updated    Co-evaluation                 AM-PAC OT "6 Clicks" Daily Activity     Outcome Measure   Help from another person eating meals?: None Help from another person taking care of personal grooming?: A Little Help from another person toileting, which includes using toliet, bedpan, or urinal?: A Little Help from another person bathing (including washing, rinsing, drying)?: A Little Help from another person to put on and taking off regular upper body clothing?: A Little Help from another person to put on and taking off regular lower body clothing?: A Little 6 Click Score: 19    End of Session Equipment Utilized During Treatment: Rolling walker;Oxygen  OT Visit Diagnosis: Unsteadiness on feet (R26.81);Muscle weakness (generalized) (M62.81)   Activity Tolerance Patient tolerated treatment well   Patient Left with call bell/phone within reach;in chair   Nurse Communication Mobility status;Other (comment) (O2 status)        Time: 6010-9323 OT Time Calculation (min): 23 min  Charges: OT General Charges $OT Visit: 1 Visit OT Treatments $Self Care/Home Management : 8-22 mins $Therapeutic Activity: 8-22 mins  Rockney Ghee, M.S., OTR/L Ascom: [phone] 10/15/20, 4:16 PM

## 2020-10-15 NOTE — Progress Notes (Signed)
SATURATION QUALIFICATIONS: (This note is used to comply with regulatory documentation for home oxygen)  Patient Saturations on Room Air at Rest = 88%  Patient Saturations on Room Air while Ambulating = 78%  Patient Saturations on 6 Liters of oxygen while Ambulating = 90%  Please briefly explain why patient needs home oxygen:

## 2020-10-15 NOTE — Progress Notes (Signed)
PROGRESS NOTE    Bao Coreas  RJJ:[id number] DOB: Nov 07, 1931 DOA: 10/04/2020 PCP: Lorenso Quarry, NP    Brief Narrative:  April Weaver is a 87 y.o. female with history of hypertension, hyperlipidemia, osteoporosis, overactive bladder, anxiety, chronic right lower quadrant pain, CKD, who presented to the emergency room on 5/27 after possible seizure episode was noticed by family at home.Patient was recently diagnosed on May 24 with COVID.  She was prescribed Levaquin, prednisone, and symptomatic medications.  No further seizures in the emergency room.  Admitted to the hospitalist service.  Noted to be hypoxic requiring 2 to 3 L of oxygen by nasal cannula.  LFTs also noted to be elevated, so GI consulted.  Procalcitonin also mildly elevated and patient put on cefepime.  Patient treated with steroids.  Patient and daughter have refused Remdisivir over concerns of renal failure.  Patient was showing signs of improvement with oxygenation improving, however on night of 5/31, oxygenation worsened and patient currently on 7 L high flow nasal cannula.  V/Q ruled out PE.  Procalcitonin minimally elevated with CRP which previously had been trending downward starting to come back up.  Vancomycin and cefepime added.  By 6/2, patient requiring 10 L high flow nasal cannula.    Since then, patient has been steadily improving with oxygen needs trending downward, today down to 4 L.  CRP trending downward as well.   Consultants:  neurology  Procedures:  VQ scan 6/2: Low probability of pulmonary embolus  Antimicrobials:   IV cefepime 6/2-present  IV vancomycin 6/2-present    Objective: Vitals:   10/14/20 2026 10/15/20 0011 10/15/20 0353 10/15/20 0745  BP: (!) 128/59 (!) 115/58 129/71 (!) 142/65  Pulse: 79 77 66 70  Resp: (!) 22 20 20 16   Temp: 99.1 F (37.3 C) 98.4 F (36.9 C) 97.7 F (36.5 C) 98.5 F (36.9 C)  TempSrc: Oral Oral Oral   SpO2: 94% 95% 98% 94%  Weight:       Height:       No intake or output data in the 24 hours ending 10/15/20 1233 Filed Weights   10/04/20 2024  Weight: 51.4 kg    Examination: General: Alert and oriented x2, no acute distress HEENT: Normocephalic and atraumatic, mucous membranes are slightly dry Neck: Supple, no JVD Cardiovascular: Regular rate and rhythm, S1-S2 Lungs: Clear to auscultation bilaterally Abdomen: Soft, nontender, nondistended, hypoactive bowel sounds Extremities: No clubbing or cyanosis or edema Neuro: No focal deficits Psychiatry: Patient is appropriate, no evidence of psychoses  Data Reviewed: I have personally reviewed following labs and imaging studies  CBC: Recent Labs  Lab 10/09/20 0418  WBC 8.6  NEUTROABS 7.4  HGB 10.9*  HCT 32.0*  MCV 93.0  PLT 148*   Basic Metabolic Panel: Recent Labs  Lab 10/09/20 0418 10/10/20 0354 10/11/20 0431 10/12/20 0549 10/13/20 0534 10/14/20 0632  NA 136 137 135  --  137 137  K 4.8 4.1 4.5  --  4.4 4.8  CL 102 104 106  --  109 110  CO2 26 22 21*  --  19* 20*  GLUCOSE 181* 162* 176*  --  160* 158*  BUN 33* 45* 46*  --  52* 49*  CREATININE 1.22* 1.24* 1.13* 1.05* 1.11* 1.19*  CALCIUM 8.5* 8.5* 8.5*  --  8.7* 8.8*  MG 1.8  --   --   --   --   --   PHOS 4.1  --   --   --   --   --  GFR: Estimated Creatinine Clearance: 27.5 mL/min (A) (by C-G formula based on SCr of 1.19 mg/dL (H)). Liver Function Tests: Recent Labs  Lab 10/09/20 0418 10/10/20 0354 10/14/20 0632  AST 76* 88* 56*  ALT 72* 76* 86*  ALKPHOS 36* 37* 39  BILITOT 0.6 0.8 0.6  PROT 6.0* 6.1* 6.1*  ALBUMIN 2.5* 2.5* 2.4*   No results for input(s): LIPASE, AMYLASE in the last 168 hours. No results for input(s): AMMONIA in the last 168 hours. Coagulation Profile: No results for input(s): INR, PROTIME in the last 168 hours. Cardiac Enzymes: No results for input(s): CKTOTAL, CKMB, CKMBINDEX, TROPONINI in the last 168 hours. BNP (last 3 results) No results for input(s): PROBNP  in the last 8760 hours. HbA1C: No results for input(s): HGBA1C in the last 72 hours. CBG: Recent Labs  Lab 10/13/20 2110 10/14/20 0808  GLUCAP 110* 139*   Lipid Profile: No results for input(s): CHOL, HDL, LDLCALC, TRIG, CHOLHDL, LDLDIRECT in the last 72 hours. Thyroid Function Tests: No results for input(s): TSH, T4TOTAL, FREET4, T3FREE, THYROIDAB in the last 72 hours. Anemia Panel: No results for input(s): VITAMINB12, FOLATE, FERRITIN, TIBC, IRON, RETICCTPCT in the last 72 hours. Sepsis Labs: Recent Labs  Lab 10/10/20 0354 10/12/20 0549  PROCALCITON 0.59 0.34    Recent Results (from the past 240 hour(s))  MRSA PCR Screening     Status: None   Collection Time: 10/10/20 10:18 AM   Specimen: Nasal Mucosa; Nasopharyngeal  Result Value Ref Range Status   MRSA by PCR NEGATIVE NEGATIVE Final    Comment:        The GeneXpert MRSA Assay (FDA approved for NASAL specimens only), is one component of a comprehensive MRSA colonization surveillance program. It is not intended to diagnose MRSA infection nor to guide or monitor treatment for MRSA infections. Performed at [facility], [address] [place], [place] [address]          Radiology Studies: No results found.      Scheduled Meds: . dexamethasone  6 mg Oral Q24H  . enoxaparin (LOVENOX) injection  30 mg Subcutaneous Q24H  . feeding supplement  237 mL Oral TID BM  . Ipratropium-Albuterol  1 puff Inhalation Q6H  . mirabegron ER  50 mg Oral Daily  . multivitamin with minerals  1 tablet Oral Daily  . pantoprazole  40 mg Oral Daily  . polyethylene glycol  17 g Oral Daily   Continuous Infusions:   Assessment & Plan:   Active Problems:   GERD (gastroesophageal reflux disease)   HLD (hyperlipidemia)   Hypertension   Osteoporosis, post-menopausal   OAB (overactive bladder)   Acute hypoxemic respiratory failure due to COVID-19 [facility])   Anxiety   Chronic right lower quadrant pain   CRI (chronic  renal insufficiency), stage 3 (moderate) (HCC)   Protein-calorie malnutrition, severe   Severe sepsis, present on admission secondary to bacterial pneumonia and COVID causing acute respiratory failure with hypoxia, present on admission: Patient meets criteria for severe sepsis on admission given lactic acidosis, tachypnea, tachycardia.  Sepsis episode stabilized.  Breathing initially improved, but then worsened, requiring high flow oxygen.  Given rising CRP and ferritin levels minimally changed from previous, I am concerned about worsening COVID, especially in lieu of only getting steroids and not Remdisivir.  Reaffirmed with daughter that they refused Remdisivir.  Chest x-ray more consistent with worsening COVID.  CRP equivocal.  VQ scan ruled out PE.  Procalcitonin minimally elevated broad-spectrum antibiotics added.  Procalcitonin, oxygenation and  CRP slowly continues to improve now.  Still requiring 4 L oxygen and gets more hypoxic on minimal ambulation per nursing.  #seizure like episode-at home Has hx/o seizures a decade ago .was on Dilantin and weaned off years ago.  Neurology's input was appreciated-clinical events certainly could be seizure, in this case provoked by infection and treatment with fluoroquinolone No indication for EEG, she already has history of epilepsy No indication to start AED capitalize for single provoked seizure at this time.  If she were to have a second event we will consider starting AED at that time Patient counseled not to drive for at least 6 months after last seizure per [place] law If she has any further events she may seek referral to neurology from her outpatient PCP 5/31 MRI of brain was obtained for any new pathology which was negative for any acute abnormality  No seizure activity while hospitalized  Neurology signed off   Elevated LFT- likely from covid.  Continuing to monitor.  Not on Remdisivir and statin was discontinued.  Bilateral upper quadrant ultrasound  negative.  GI consulted who felt that this could be likely from infection.  Viral hepatitis panel negative.  AST improved while ALT slightly increased.  Equivocal.  #AKI on CKD 3b Likely prerenal.  Improved with IV fluids and creatinine down to 1.2 however GFR at 45.  Remained stable  Severe protein calorie malnutrition: Nutrition Problem: Severe Malnutrition Etiology: chronic illness Signs/Symptoms: moderate fat depletion,severe fat depletion,severe muscle depletion Interventions: Ensure Enlive (each supplement provides 350kcal and 20 grams of protein),Magic cup,MVI Appreciate nutritionist help.  #Elevated troponin-minimal Likely demand ischemia.  No chest pain  Hypertension- continues to remain within normal limits despite not of her oral medications.  Continue to hold home bp meds.  Osteoporosis-hold alendronate/supplements to reduce trips in and out of room  Hyperlipidemia-continue statin  OAB-continue mirabegron  GERD-continue PPI   DVT prophylaxis: Lovenox Code Status: Full Family Communication: None today.  Discussed with patient  Status is: Inpatient  Remains inpatient appropriate because:Inpatient level of care appropriate due to severity of illness   Dispo: The patient is from: Home              Anticipated d/c is to: Home with home health              Patient currently is not medically stable to d/c.   Difficult to place patient No  Continue to wean off oxygen.  Patient does not use any oxygen at home at baseline and currently on 4 L   LOS: 11 days     Delfino Lovett, MD [facility] Pager [phone]  If 7PM-7AM, please contact night-coverage 10/15/2020, 12:33 PM

## 2020-10-16 ENCOUNTER — Encounter: Payer: Self-pay | Admitting: Family Medicine

## 2020-10-16 LAB — CBC
HCT: 28.4 % — ABNORMAL LOW (ref 36.0–46.0)
Hemoglobin: 9.7 g/dL — ABNORMAL LOW (ref 12.0–15.0)
MCH: 31.9 pg (ref 26.0–34.0)
MCHC: 34.2 g/dL (ref 30.0–36.0)
MCV: 93.4 fL (ref 80.0–100.0)
Platelets: 279 10*3/uL (ref 150–400)
RBC: 3.04 MIL/uL — ABNORMAL LOW (ref 3.87–5.11)
RDW: 13 % (ref 11.5–15.5)
WBC: 7.4 10*3/uL (ref 4.0–10.5)
nRBC: 0 % (ref 0.0–0.2)

## 2020-10-16 LAB — BASIC METABOLIC PANEL
Anion gap: 6 (ref 5–15)
BUN: 42 mg/dL — ABNORMAL HIGH (ref 8–23)
CO2: 22 mmol/L (ref 22–32)
Calcium: 8.6 mg/dL — ABNORMAL LOW (ref 8.9–10.3)
Chloride: 106 mmol/L (ref 98–111)
Creatinine, Ser: 1.19 mg/dL — ABNORMAL HIGH (ref 0.44–1.00)
GFR, Estimated: 45 mL/min — ABNORMAL LOW (ref >60)
Glucose, Bld: 155 mg/dL — ABNORMAL HIGH (ref 70–99)
Potassium: 4.7 mmol/L (ref 3.5–5.1)
Sodium: 134 mmol/L — ABNORMAL LOW (ref 135–145)

## 2020-10-16 NOTE — Progress Notes (Signed)
SATURATION QUALIFICATIONS: (This note is used to comply with regulatory documentation for home oxygen)  Patient Saturations on Room Air at Rest = 89%  Patient Saturations on Room Air while Ambulating = 85%  Patient Saturations on 3 Liters of oxygen while Ambulating = 92%  Please briefly explain why patient needs home oxygen: 

## 2020-10-16 NOTE — Progress Notes (Signed)
Progress Note    April Weaver  ZOX:[id number] DOB: 06-24-1931  DOA: 10/04/2020 PCP: Lorenso Quarry, NP      Brief Narrative:    Medical records reviewed and are as summarized below:  April Weaver is a 87 y.o. femalewith history of hypertension, hyperlipidemia, osteoporosis, overactive bladder, anxiety, chronic right lower quadrant pain, CKD, who presented to the emergency room on 5/27 after possible seizure episode was noticed by family at home.Patient was recently diagnosed on May 24 with COVID. She was prescribed Levaquin, prednisone, and symptomatic medications.  No further seizures in the emergency room.  Admitted to the hospitalist service.  Noted to be hypoxic requiring 2 to 3 L of oxygen by nasal cannula.  LFTs also noted to be elevated, so GI consulted.  Procalcitonin also mildly elevated and patient put on cefepime.  Patient treated with steroids.  Patient and daughter have refused Remdisivir over concerns of renal failure.  She developed worsening acute hypoxic respiratory failure and was requiring up to 10 L oxygen via HFNC.  VQ scan ruled out pulmonary embolism.  He was treated with empiric IV antibiotics (vancomycin and cefepime).  Her condition has slowly improved.  PT and OT recommended discharge to SNF.  However, patient refuses to go to SNF.      Assessment/Plan:   Active Problems:   GERD (gastroesophageal reflux disease)   HLD (hyperlipidemia)   Hypertension   Osteoporosis, post-menopausal   OAB (overactive bladder)   Acute hypoxemic respiratory failure due to COVID-19 [facility])   Anxiety   Chronic right lower quadrant pain   CRI (chronic renal insufficiency), stage 3 (moderate) (HCC)   Protein-calorie malnutrition, severe   Nutrition Problem: Severe Malnutrition Etiology: chronic illness  Signs/Symptoms: moderate fat depletion,severe fat depletion,severe muscle depletion   Body mass index is 20.07 kg/m.    Severe sepsis, present on  admission secondary to bacterial pneumonia and COVID Completed IV antibiotics.  Patient and daughter refused remdesivir.  Continue dexamethasone.  Acute hypoxic respiratory failure: VQ scan ruled out pulmonary embolism.  Hypoxia is improving.  Oxygen saturation on room air at rest was 89% and oxygen saturation on room air while ambulating was 85%.  Oxygen saturation was 92% on 3 L/min oxygen.  Continue to taper down oxygen and wean off as able.   #seizure like episode-at home Has hx/o seizures a decade ago .she was on Dilantin and weaned off years ago.  Neurology's input was appreciated-clinical events certainly could be seizure, in this case provoked by infection and treatment with fluoroquinolone No indication for EEG, she already has history of epilepsy No indication to start AED after single provoked seizure at this time per neurologist.  If she were to have a second event we will consider starting AED at that time Patient counseled not to drive for at least 6 months after last seizure per  law If she has any further events she may seek referral to neurology from her outpatient PCP 5/31 MRI of brain was obtained for any new pathology which was negative for any acute abnormality  No seizure activity while hospitalized  Neurology signed off   Elevated liver enzymes- likely from covid.  Liver enzymes are slowly trending downwards.   Statin has been discontinued. Bilateral upper quadrant ultrasound negative.  GI consulted who felt that this could be likely from infection.  Viral hepatitis panel negative  #AKIon CKD 3b Likely prerenal.  Improved with IV fluids.  Creatinine is stable.  #Elevated troponin-minimal Likely demand  ischemia.  No chest pain  General weakness/debility Patient said she felt wobbly and unsteady on her feet today and does not feel comfortable going home today.  Continue PT.   Other comorbidities include hypertension, osteoporosis, GERD, hyperlipidemia,  overactive bladder      Diet Order            Diet regular Room service appropriate? Yes; Fluid consistency: Thin  Diet effective now                    Consultants:  Gastroenterologist consulted for elevated liver enzymes  Neurologist consulted for seizure-like activity  Procedures:  None    Medications:   . dexamethasone  6 mg Oral Q24H  . enoxaparin (LOVENOX) injection  30 mg Subcutaneous Q24H  . feeding supplement  237 mL Oral TID BM  . Ipratropium-Albuterol  1 puff Inhalation Q6H  . mirabegron ER  50 mg Oral Daily  . multivitamin with minerals  1 tablet Oral Daily  . pantoprazole  40 mg Oral Daily  . polyethylene glycol  17 g Oral Daily   Continuous Infusions:   Anti-infectives (From admission, onward)   Start     Dose/Rate Route Frequency Ordered Stop   10/14/20 2102  ceFEPIme (MAXIPIME) 2 g in sodium chloride 0.9 % 100 mL IVPB        2 g 200 mL/hr over 30 Minutes Intravenous Every 24 hours 10/14/20 0755 10/14/20 2058   10/12/20 2200  ceFEPIme (MAXIPIME) 2 g in sodium chloride 0.9 % 100 mL IVPB  Status:  Discontinued        2 g 200 mL/hr over 30 Minutes Intravenous Every 12 hours 10/12/20 1206 10/14/20 0755   10/11/20 1000  vancomycin (VANCOREADY) IVPB 500 mg/100 mL  Status:  Discontinued        500 mg 100 mL/hr over 60 Minutes Intravenous Every 24 hours 10/10/20 1018 10/12/20 1350   10/10/20 1115  ceFEPIme (MAXIPIME) 2 g in sodium chloride 0.9 % 100 mL IVPB  Status:  Discontinued        2 g 200 mL/hr over 30 Minutes Intravenous Every 24 hours 10/10/20 1018 10/12/20 1206   10/10/20 1115  vancomycin (VANCOREADY) IVPB 1000 mg/200 mL        1,000 mg 200 mL/hr over 60 Minutes Intravenous  Once 10/10/20 1018 10/10/20 1604   10/05/20 1000  remdesivir 100 mg in sodium chloride 0.9 % 100 mL IVPB  Status:  Discontinued       "Followed by" Linked Group Details   100 mg 200 mL/hr over 30 Minutes Intravenous Daily 10/04/20 2000 10/04/20 2022   10/05/20 1000   remdesivir 100 mg in sodium chloride 0.9 % 100 mL IVPB  Status:  Discontinued       "Followed by" Linked Group Details   100 mg 200 mL/hr over 30 Minutes Intravenous Daily 10/04/20 1939 10/05/20 0822   10/05/20 1000  ceFEPIme (MAXIPIME) 2 g in sodium chloride 0.9 % 100 mL IVPB  Status:  Discontinued        2 g 200 mL/hr over 30 Minutes Intravenous Every 24 hours 10/05/20 0825 10/09/20 1158   10/04/20 2030  remdesivir 200 mg in sodium chloride 0.9% 250 mL IVPB  Status:  Discontinued       "Followed by" Linked Group Details   200 mg 580 mL/hr over 30 Minutes Intravenous Once 10/04/20 1939 10/05/20 1129   10/04/20 1945  remdesivir 200 mg in sodium chloride 0.9% 250 mL IVPB  Status:  Discontinued       "Followed by" Linked Group Details   200 mg 580 mL/hr over 30 Minutes Intravenous Once 10/04/20 2000 10/04/20 2022             Family Communication/Anticipated D/C date and plan/Code Status   DVT prophylaxis: enoxaparin (LOVENOX) injection 30 mg Start: 10/14/20 2200     Code Status: Full Code  Family Communication: None Disposition Plan:    Status is: Inpatient  Remains inpatient appropriate because:Unsafe d/c plan and Inpatient level of care appropriate due to severity of illness   Dispo: The patient is from: Home              Anticipated d/c is to: Home              Patient currently is not medically stable to d/c.   Difficult to place patient No           Subjective:   C/o generalized weakness and unsteady gait.  Initially, patient insisted on going home today.  However, she later said that she did not feel comfortable going home today because she was very unsteady on her feet after ambulating for ambulatory pulse oximetry.  Objective:    Vitals:   10/15/20 2045 10/16/20 0057 10/16/20 0533 10/16/20 1115  BP: 109/60 (!) 103/53 122/71 (!) 128/59  Pulse: 92 89 74 70  Resp: 17 17 17 20   Temp: 98.3 F (36.8 C) 98.2 F (36.8 C) 97.7 F (36.5 C) 97.9 F  (36.6 C)  TempSrc:  Oral Oral   SpO2: 92% 98% 96% 99%  Weight:      Height:       No data found.  No intake or output data in the 24 hours ending 10/16/20 1334 Filed Weights   10/04/20 2024  Weight: 51.4 kg    Exam:  GEN: NAD SKIN: No rash EYES: EOMI ENT: MMM CV: RRR PULM: CTA B ABD: soft, ND, NT, +BS CNS: AAO x 3, non focal EXT: No edema or tenderness        Data Reviewed:   I have personally reviewed following labs and imaging studies:  Labs: Labs show the following:   Basic Metabolic Panel: Recent Labs  Lab 10/10/20 0354 10/11/20 0431 10/12/20 0549 10/13/20 0534 10/14/20 0632 10/16/20 0443  NA 137 135  --  137 137 134*  K 4.1 4.5  --  4.4 4.8 4.7  CL 104 106  --  109 110 106  CO2 22 21*  --  19* 20* 22  GLUCOSE 162* 176*  --  160* 158* 155*  BUN 45* 46*  --  52* 49* 42*  CREATININE 1.24* 1.13* 1.05* 1.11* 1.19* 1.19*  CALCIUM 8.5* 8.5*  --  8.7* 8.8* 8.6*   GFR Estimated Creatinine Clearance: 27.5 mL/min (A) (by C-G formula based on SCr of 1.19 mg/dL (H)). Liver Function Tests: Recent Labs  Lab 10/10/20 0354 10/14/20 0632  AST 88* 56*  ALT 76* 86*  ALKPHOS 37* 39  BILITOT 0.8 0.6  PROT 6.1* 6.1*  ALBUMIN 2.5* 2.4*   No results for input(s): LIPASE, AMYLASE in the last 168 hours. No results for input(s): AMMONIA in the last 168 hours. Coagulation profile No results for input(s): INR, PROTIME in the last 168 hours.  CBC: Recent Labs  Lab 10/16/20 0443  WBC 7.4  HGB 9.7*  HCT 28.4*  MCV 93.4  PLT 279   Cardiac Enzymes: No results for input(s): CKTOTAL, CKMB, CKMBINDEX, TROPONINI in  the last 168 hours. BNP (last 3 results) No results for input(s): PROBNP in the last 8760 hours. CBG: Recent Labs  Lab 10/13/20 2110 10/14/20 0808  GLUCAP 110* 139*   D-Dimer: No results for input(s): DDIMER in the last 72 hours. Hgb A1c: No results for input(s): HGBA1C in the last 72 hours. Lipid Profile: No results for input(s): CHOL,  HDL, LDLCALC, TRIG, CHOLHDL, LDLDIRECT in the last 72 hours. Thyroid function studies: No results for input(s): TSH, T4TOTAL, T3FREE, THYROIDAB in the last 72 hours.  Invalid input(s): FREET3 Anemia work up: No results for input(s): VITAMINB12, FOLATE, FERRITIN, TIBC, IRON, RETICCTPCT in the last 72 hours. Sepsis Labs: Recent Labs  Lab 10/10/20 0354 10/12/20 0549 10/16/20 0443  PROCALCITON 0.59 0.34  --   WBC  --   --  7.4    Microbiology Recent Results (from the past 240 hour(s))  MRSA PCR Screening     Status: None   Collection Time: 10/10/20 10:18 AM   Specimen: Nasal Mucosa; Nasopharyngeal  Result Value Ref Range Status   MRSA by PCR NEGATIVE NEGATIVE Final    Comment:        The GeneXpert MRSA Assay (FDA approved for NASAL specimens only), is one component of a comprehensive MRSA colonization surveillance program. It is not intended to diagnose MRSA infection nor to guide or monitor treatment for MRSA infections. Performed at [facility], [address] [place], [place] [address]     Procedures and diagnostic studies:  No results found.             LOS: 12 days   Nicloe Frontera  [facility] [employer] loss adjuster on [email or web address]. If 7PM-7AM, please contact night-coverage at [email or web address]     10/16/2020, 1:34 PM

## 2020-10-17 DIAGNOSIS — J9611 Chronic respiratory failure with hypoxia: Secondary | ICD-10-CM | POA: Clinically undetermined

## 2020-10-17 NOTE — Care Management Important Message (Signed)
Important Message  Patient Details  Name: April Weaver MRN: [id number] Date of Birth: [birth year]   Medicare Important Message Given:  Other (see comment)  Patient is in an isolation room so I tried calling a couple of times to review the Important Message from Medicare but no answer. Will try again.  Olegario Messier A Sariyah Corcino 10/17/2020, 3:26 PM

## 2020-10-17 NOTE — TOC Transition Note (Signed)
Transition of Care [facility]) - CM/SW Discharge Note   Patient Details  Name: Ciera Beckum MRN: [id number] Date of Birth: 1932-04-02  Transition of Care [facility]) CM/SW Contact:  Allayne Butcher, RN Phone Number: 10/17/2020, 12:12 PM   Clinical Narrative:    Patient medically cleared for discharge home with home health services and home oxygen.  Barbara Cower with [facility] accepted home health referral and is aware of discharge today.  Zach with [facility] has accepted the referral for oxygen.  Oxygen will be delivered to the room before discharge.  Walker and 3 in 1 already delivered by [employer].  Patient's daughter will pick patient up and transport home.    Final next level of care: Home w Home Health Services Barriers to Discharge: Barriers Resolved   Patient Goals and CMS Choice Patient states their goals for this hospitalization and ongoing recovery are:: Patient wants to get back home CMS Medicare.gov Compare Post Acute Care list provided to:: Patient Choice offered to / list presented to : Patient  Discharge Placement                       Discharge Plan and Services   Discharge Planning Services: CM Consult Post Acute Care Choice: Home Health          DME Arranged: Oxygen DME Agency: [facility] Date DME Agency Contacted: 10/17/20 Time DME Agency Contacted: 1125 Representative spoke with at DME Agency: Ian Malkin HH Arranged: RN, PT, OT [facility] Agency: [facility] Date HH Agency Contacted: 10/17/20 Time HH Agency Contacted: 1212 Representative spoke with at [facility] Agency: Barbara Cower  Social Determinants of Health (SDOH) Interventions     Readmission Risk Interventions No flowsheet data found.

## 2020-10-17 NOTE — Discharge Summary (Addendum)
Physician Discharge Summary  April Weaver JKK:[id number] DOB: 10-29-1931 DOA: 10/04/2020  PCP: Lorenso Quarry, NP  Admit date: 10/04/2020 Discharge date: 10/17/2020  Discharge disposition: Home with home health therapy   Recommendations for Outpatient Follow-Up:   Follow-up with PCP in 1 week   Discharge Diagnosis:   Active Problems:   GERD (gastroesophageal reflux disease)   HLD (hyperlipidemia)   Hypertension   Osteoporosis, post-menopausal   OAB (overactive bladder)   Acute hypoxemic respiratory failure due to COVID-19 [facility])   Anxiety   Chronic right lower quadrant pain   CRI (chronic renal insufficiency), stage 3 (moderate) (HCC)   Protein-calorie malnutrition, severe   Chronic respiratory failure with hypoxia (HCC)    Discharge Condition: Stable.  Diet recommendation:  Diet Order             Diet - low sodium heart healthy           Diet regular Room service appropriate? Yes; Fluid consistency: Thin  Diet effective now                     Code Status: Full Code     Hospital Course:    [name] is a 87 y.o. female with history of hypertension, hyperlipidemia, osteoporosis, overactive bladder, anxiety, chronic right lower quadrant pain, CKD, who presented to the emergency room on 5/27 after possible seizure episode was noticed by family at home.  She was recently diagnosed on May 24 with COVID.  She was prescribed Levaquin, prednisone, and symptomatic medications.  No further seizures in the emergency room.  She was noted to be hypoxic requiring 2 to 3 L of oxygen by nasal cannula.  Liver enzymes were also elevated so GI consulted.  Procalcitonin also mildly elevated and patient put on cefepime.   She was admitted to the hospital for severe sepsis secondary to COVID-pneumonia and bacterial pneumonia.  She was treated with steroids.  Patient and daughter refused Remdisivir over concerns of renal failure.  She developed worsening acute  hypoxic respiratory failure and was requiring up to 10 L oxygen via HFNC.  VQ scan ruled out pulmonary embolism.  She was treated with empiric IV antibiotics (vancomycin and cefepime).  Her condition has slowly improved.  Unfortunately, she cannot be completely weaned off of oxygen.  Oxygen saturation on room air at rest was 89%, oxygen saturation on room air while ambulating was 85% and oxygen saturation on 3 L/min oxygen while ambulating was 92%.  She will be discharged home on 3 L/min oxygen via nasal cannula.  PT and OT recommended discharge to home with home health therapy.  She is deemed stable for discharge to home today. Discharge plan discussed with her daughter, April Weaver, who is an Charity fundraiser.      Medical Consultants:   Gastroenterologist Neurologist   Discharge Exam:    Vitals:   10/17/20 0010 10/17/20 0359 10/17/20 0803 10/17/20 1204  BP: (!) 127/59 134/74 127/87 135/71  Pulse: 69 70 80 70  Resp: 20 16 16 18   Temp: 97.6 F (36.4 C) 97.6 F (36.4 C) (!) 97.5 F (36.4 C) (!) 97.5 F (36.4 C)  TempSrc: Oral Oral Oral Oral  SpO2: 97% 96% 96% 98%  Weight:      Height:         GEN: NAD SKIN: Warm and dry EYES: EOMI, no pallor or icterus ENT: MMM CV: RRR PULM: CTA B ABD: soft, ND, NT, +BS CNS: AAO x 3, non focal  EXT: No edema or tenderness   The results of significant diagnostics from this hospitalization (including imaging, microbiology, ancillary and laboratory) are listed below for reference.     Procedures and Diagnostic Studies:   CT Head Wo Contrast  Result Date: 10/04/2020 CLINICAL DATA:  Seizure. EXAM: CT HEAD WITHOUT CONTRAST TECHNIQUE: Contiguous axial images were obtained from the base of the skull through the vertex without intravenous contrast. COMPARISON:  None. FINDINGS: Brain: Mild chronic ischemic white matter disease is noted. No mass effect or midline shift is noted. Ventricular size is within normal limits. There is no evidence of mass lesion,  hemorrhage or acute infarction. Vascular: No hyperdense vessel or unexpected calcification. Skull: Normal. Negative for fracture or focal lesion. Sinuses/Orbits: No acute finding. Other: None. IMPRESSION: No acute intracranial abnormality seen. Electronically Signed   By: Lupita Raider M.D.   On: 10/04/2020 15:54   DG Chest Portable 1 View  Result Date: 10/04/2020 CLINICAL DATA:  COVID positive 5 days ago. EXAM: PORTABLE CHEST 1 VIEW COMPARISON:  None. FINDINGS: Patchy and streaky bibasilar opacities, left greater than right. Normal heart size and mediastinal contours. Aortic atherosclerosis. No significant pleural effusion. No pneumothorax. Mild biapical pleuroparenchymal scarring. No pulmonary edema. No acute osseous abnormalities are seen. IMPRESSION: Patchy and streaky bibasilar opacities, left greater than right, consistent with pneumonia in the setting of COVID-19 infection. Electronically Signed   By: Narda Rutherford M.D.   On: 10/04/2020 15:48     Labs:   Basic Metabolic Panel: Recent Labs  Lab 10/11/20 0431 10/12/20 0549 10/13/20 0534 10/14/20 0632 10/16/20 0443  NA 135  --  137 137 134*  K 4.5  --  4.4 4.8 4.7  CL 106  --  109 110 106  CO2 21*  --  19* 20* 22  GLUCOSE 176*  --  160* 158* 155*  BUN 46*  --  52* 49* 42*  CREATININE 1.13* 1.05* 1.11* 1.19* 1.19*  CALCIUM 8.5*  --  8.7* 8.8* 8.6*   GFR Estimated Creatinine Clearance: 27.5 mL/min (A) (by C-G formula based on SCr of 1.19 mg/dL (H)). Liver Function Tests: Recent Labs  Lab 10/14/20 0632  AST 56*  ALT 86*  ALKPHOS 39  BILITOT 0.6  PROT 6.1*  ALBUMIN 2.4*   No results for input(s): LIPASE, AMYLASE in the last 168 hours. No results for input(s): AMMONIA in the last 168 hours. Coagulation profile No results for input(s): INR, PROTIME in the last 168 hours.  CBC: Recent Labs  Lab 10/16/20 0443  WBC 7.4  HGB 9.7*  HCT 28.4*  MCV 93.4  PLT 279   Cardiac Enzymes: No results for input(s): CKTOTAL,  CKMB, CKMBINDEX, TROPONINI in the last 168 hours. BNP: Invalid input(s): POCBNP CBG: Recent Labs  Lab 10/13/20 2110 10/14/20 0808  GLUCAP 110* 139*   D-Dimer No results for input(s): DDIMER in the last 72 hours. Hgb A1c No results for input(s): HGBA1C in the last 72 hours. Lipid Profile No results for input(s): CHOL, HDL, LDLCALC, TRIG, CHOLHDL, LDLDIRECT in the last 72 hours. Thyroid function studies No results for input(s): TSH, T4TOTAL, T3FREE, THYROIDAB in the last 72 hours.  Invalid input(s): FREET3 Anemia work up No results for input(s): VITAMINB12, FOLATE, FERRITIN, TIBC, IRON, RETICCTPCT in the last 72 hours. Microbiology Recent Results (from the past 240 hour(s))  MRSA PCR Screening     Status: None   Collection Time: 10/10/20 10:18 AM   Specimen: Nasal Mucosa; Nasopharyngeal  Result Value Ref Range Status  MRSA by PCR NEGATIVE NEGATIVE Final    Comment:        The GeneXpert MRSA Assay (FDA approved for NASAL specimens only), is one component of a comprehensive MRSA colonization surveillance program. It is not intended to diagnose MRSA infection nor to guide or monitor treatment for MRSA infections. Performed at [facility], [address] [place], [place] [address]      Discharge Instructions:   Discharge Instructions     Diet - low sodium heart healthy   Complete by: As directed    Face-to-face encounter (required for Medicare/Medicaid patients)   Complete by: As directed    I Jolana Runkles certify that this patient is under my care and that I, or a nurse practitioner or physician's assistant working with me, had a face-to-face encounter that meets the physician face-to-face encounter requirements with this patient on 10/17/2020. The encounter with the patient was in whole, or in part for the following medical condition(s) which is the primary reason for home health care (List medical condition): Debility   The encounter with the patient was  in whole, or in part, for the following medical condition, which is the primary reason for home health care: Debility   I certify that, based on my findings, the following services are medically necessary home health services: Physical therapy   Reason for Medically Necessary Home Health Services: Therapy- Investment banker, operational, Patent examiner   My clinical findings support the need for the above services: Unsafe ambulation due to balance issues   Further, I certify that my clinical findings support that this patient is homebound due to: Unsafe ambulation due to balance issues   Home Health   Complete by: As directed    To provide the following care/treatments:  PT OT     Increase activity slowly   Complete by: As directed       Allergies as of 10/17/2020       Reactions   Celecoxib Other (See Comments)   Shakiness   Metronidazole Nausea Only   Typhoid Vaccines Swelling   Penicillins Rash        Medication List     STOP taking these medications    conjugated estrogens vaginal cream Commonly known as: Premarin   levofloxacin 500 MG tablet Commonly known as: LEVAQUIN   predniSONE 20 MG tablet Commonly known as: DELTASONE       TAKE these medications    alendronate 70 MG tablet Commonly known as: FOSAMAX Take 70 mg by mouth once a week. Pt takes med on Sunday   amLODipine 5 MG tablet Commonly known as: NORVASC Take 5 mg by mouth daily.   aspirin EC 81 MG tablet Take 81 mg by mouth once.   calcium carbonate 1250 (500 Ca) MG tablet Commonly known as: OS-CAL - dosed in mg of elemental calcium Take 1 tablet by mouth daily with breakfast.   CENTRUM SILVER ADULT 50+ PO Take 1 tablet by mouth daily.   Cholecalciferol 50 MCG (2000 UT) Caps Take 1 capsule by mouth daily.   docusate calcium 240 MG capsule Commonly known as: SURFAK Take 240 mg by mouth daily.   Glucosamine-Chondroitin 750-600 MG Chew Chew by mouth.   ipratropium 0.03 % nasal  spray Commonly known as: ATROVENT Place 2 sprays into both nostrils 2 (two) times daily.   lisinopril-hydrochlorothiazide 10-12.5 MG tablet Commonly known as: ZESTORETIC Take 1 tablet by mouth daily.   lovastatin 20 MG tablet Commonly known as: MEVACOR  Take 20 mg by mouth at bedtime.   mirabegron ER 50 MG Tb24 tablet Commonly known as: MYRBETRIQ Take 50 mg by mouth daily. What changed: Another medication with the same name was removed. Continue taking this medication, and follow the directions you see here.   omeprazole 10 MG capsule Commonly known as: PRILOSEC Take 10 mg by mouth daily.   Simethicone 125 MG Caps Take 1 capsule by mouth as needed.   vitamin E 180 MG (400 UNITS) capsule Take 400 Units by mouth daily.               Durable Medical Equipment  (From admission, onward)           Start     Ordered   10/17/20 1156  DME Oxygen  Once       Question Answer Comment  Length of Need 12 Months   Mode or (Route) Nasal cannula   Liters per Minute 3   Frequency Continuous (stationary and portable oxygen unit needed)   Oxygen delivery system Gas      10/17/20 1156   10/08/20 1228  For home use only DME Shower stool  Once        10/08/20 1227   10/07/20 1222  For home use only DME Walker rolling  Once       Question Answer Comment  Walker: With 5 Inch Wheels   Patient needs a walker to treat with the following condition Weakness      10/07/20 1221   10/07/20 1222  For home use only DME 3 n 1  Once        10/07/20 1224   10/07/20 1221  For home use only DME oxygen  Once       Question Answer Comment  Length of Need 6 Months   Mode or (Route) Nasal cannula   Liters per Minute 3   Frequency Continuous (stationary and portable oxygen unit needed)   Oxygen conserving device Yes   Oxygen delivery system Gas      10/07/20 1220            Follow-up Information     [facility] Follow [facility].   Specialty: Home Health Services Why:  [facility] will be calling to schedule your initial visit.                 Time coordinating discharge: 31 minutes  Signed:  Wretha Laris  [facility] 10/17/2020, 4:40 PM   Pager on [email or web address]. If 7PM-7AM, please contact night-coverage at [email or web address]

## 2020-11-21 ENCOUNTER — Other Ambulatory Visit: Payer: Self-pay | Admitting: Gerontology

## 2020-11-21 DIAGNOSIS — Z1231 Encounter for screening mammogram for malignant neoplasm of breast: Secondary | ICD-10-CM

## 2020-12-10 ENCOUNTER — Other Ambulatory Visit: Payer: Self-pay

## 2020-12-10 ENCOUNTER — Ambulatory Visit
Admission: RE | Admit: 2020-12-10 | Discharge: 2020-12-10 | Disposition: A | Discharge status: Home / Self Care | Payer: Medicare Other | Source: Ambulatory Visit | Attending: Gerontology | Admitting: Gerontology

## 2020-12-10 DIAGNOSIS — Z1231 Encounter for screening mammogram for malignant neoplasm of breast: Secondary | ICD-10-CM | POA: Diagnosis present

## 2021-11-24 ENCOUNTER — Other Ambulatory Visit: Payer: Self-pay | Admitting: Gerontology

## 2021-11-24 DIAGNOSIS — Z1231 Encounter for screening mammogram for malignant neoplasm of breast: Secondary | ICD-10-CM

## 2021-11-26 IMAGING — US US ABDOMEN LIMITED
1 series · 15 of 25 positions shown · non-contrast
Comparison: None.

CLINICAL DATA: Elevated LFTs

EXAM:
ULTRASOUND ABDOMEN LIMITED RIGHT UPPER QUADRANT

[Series 1: us abdomen limited ruq · 15 of 35 slices shown]
[im 1/35]
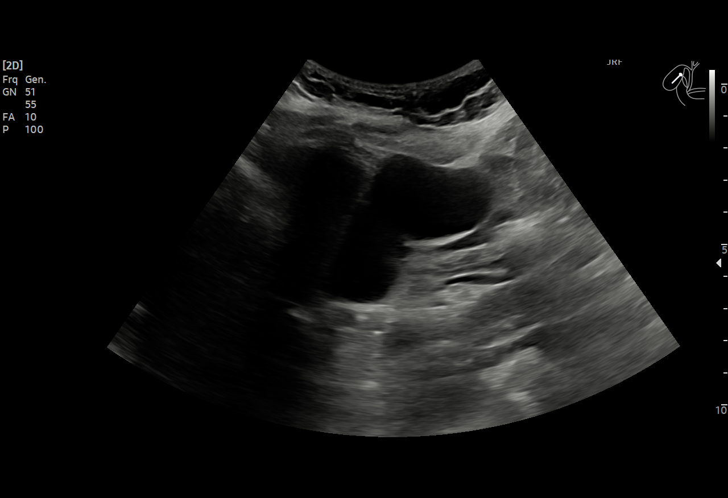
[im 3/35]
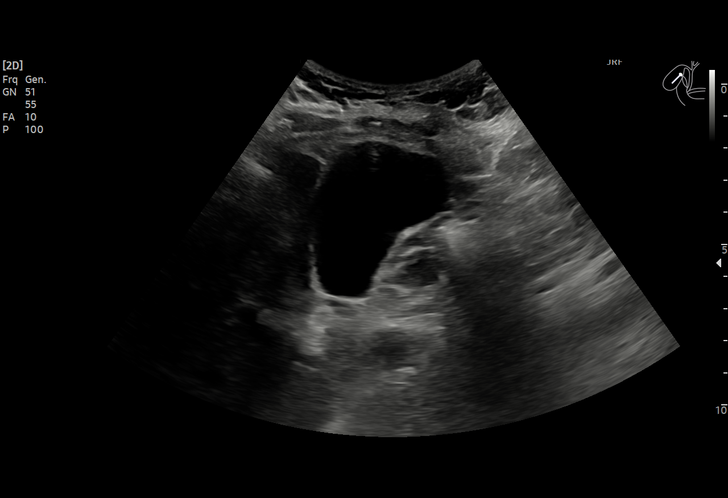
[im 6/35]
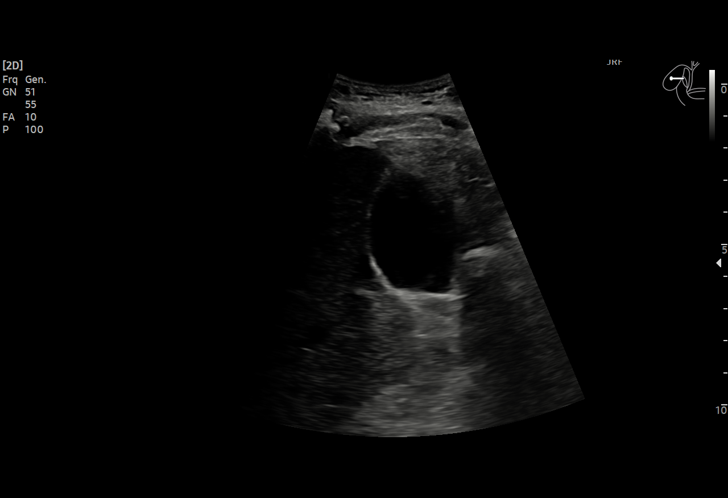
[im 8/35]
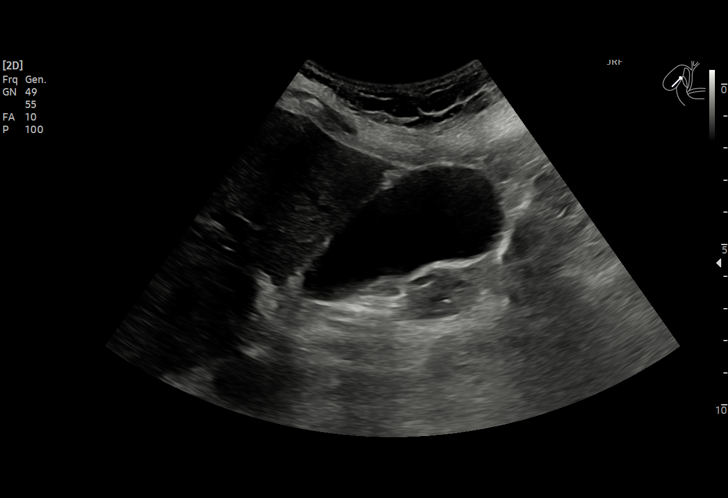
[im 10/35]
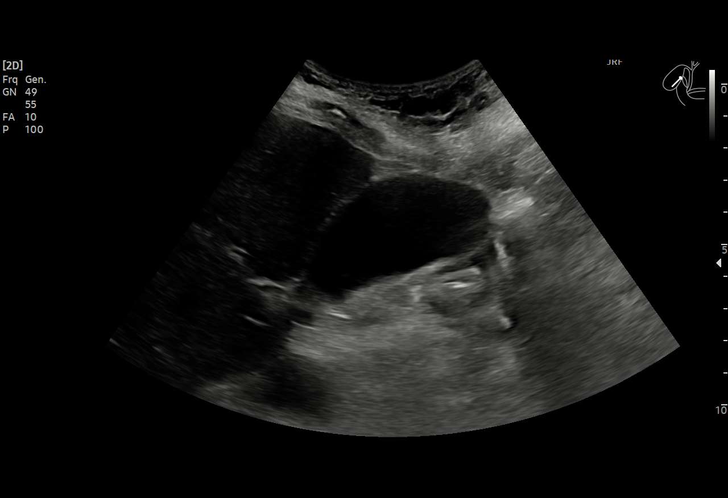
[im 13/35]
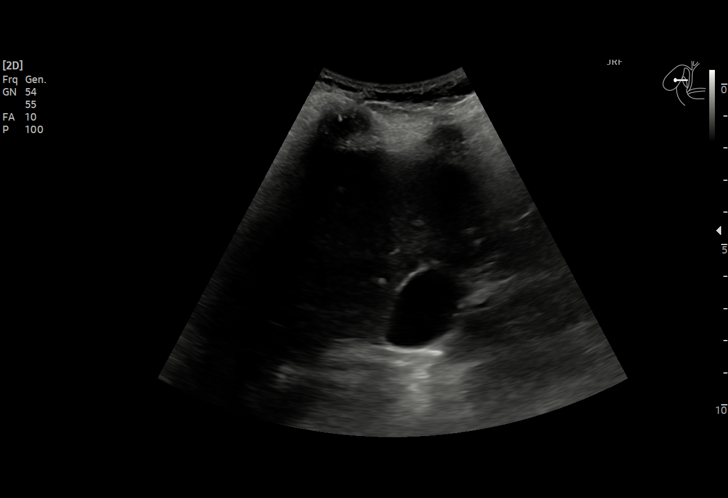
[im 15/35]
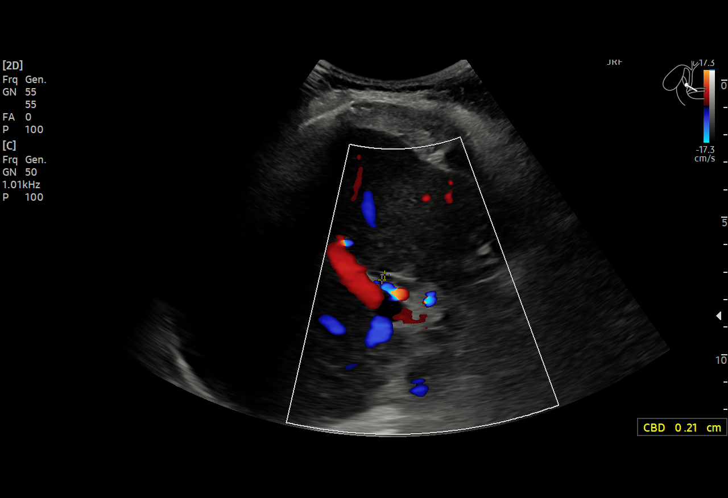
[im 18/35]
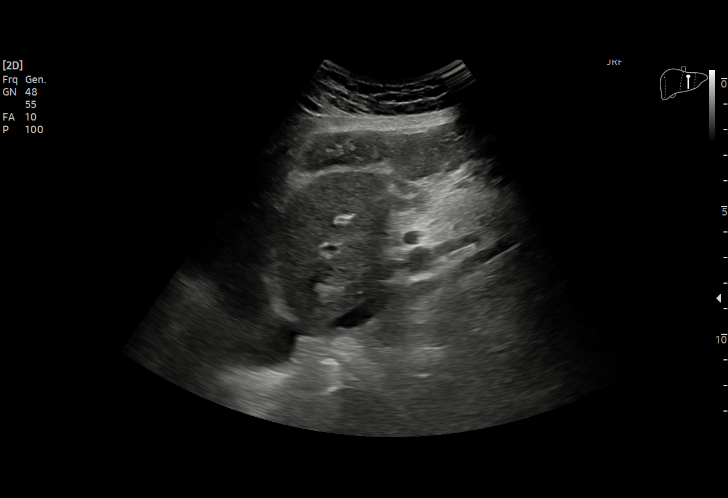
[im 20/35]
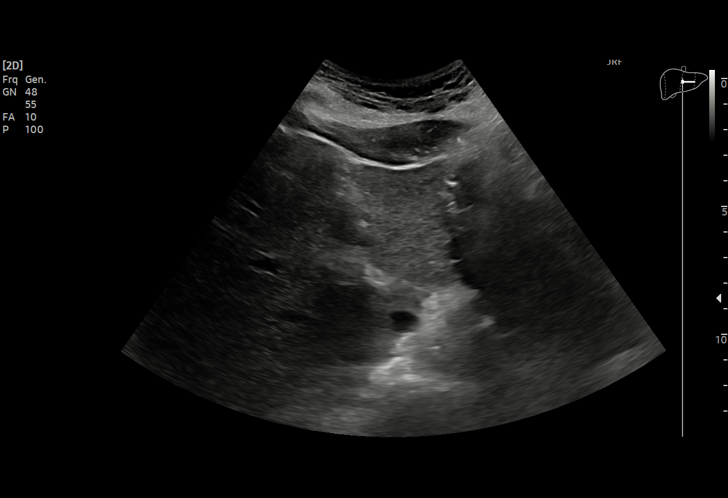
[im 22/35]
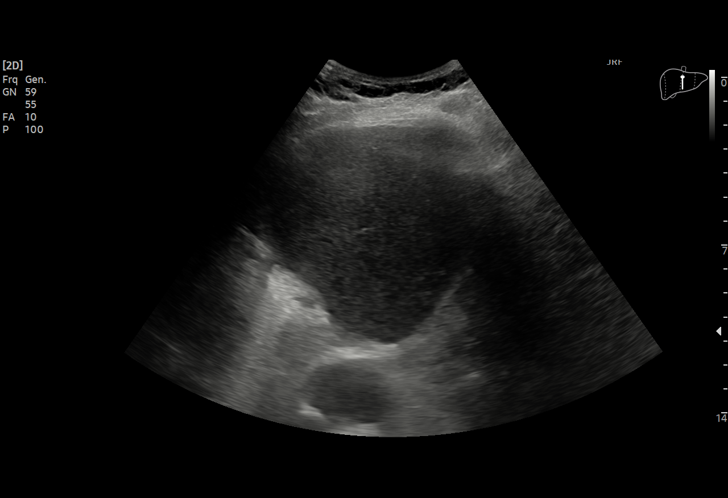
[im 25/35]
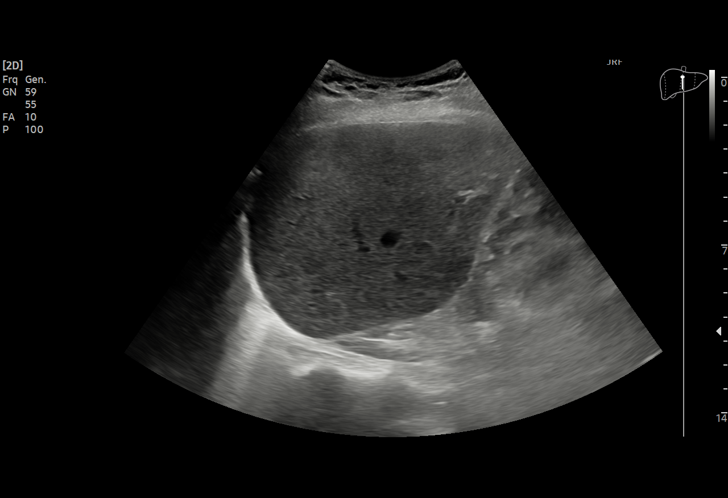
[im 27/35]
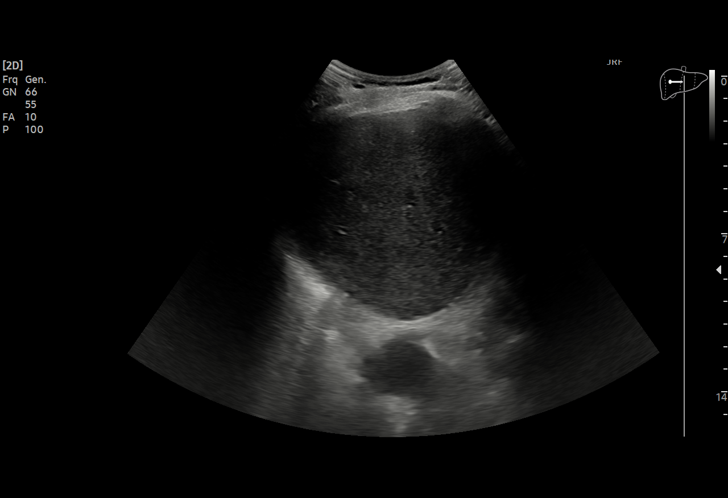
[im 29/35]
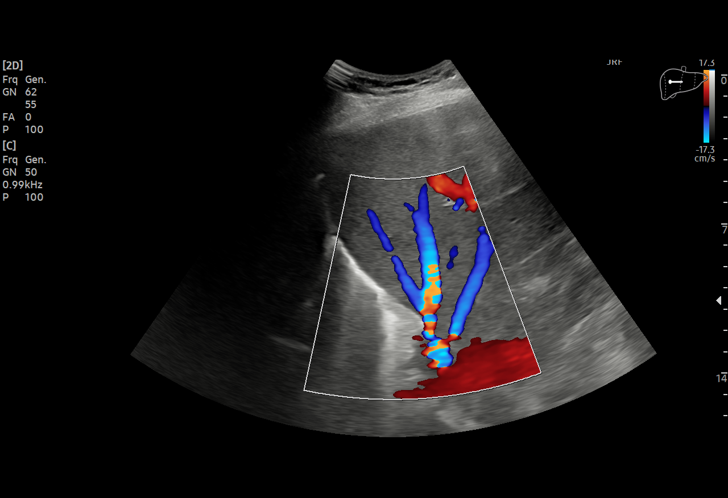
[im 32/35]
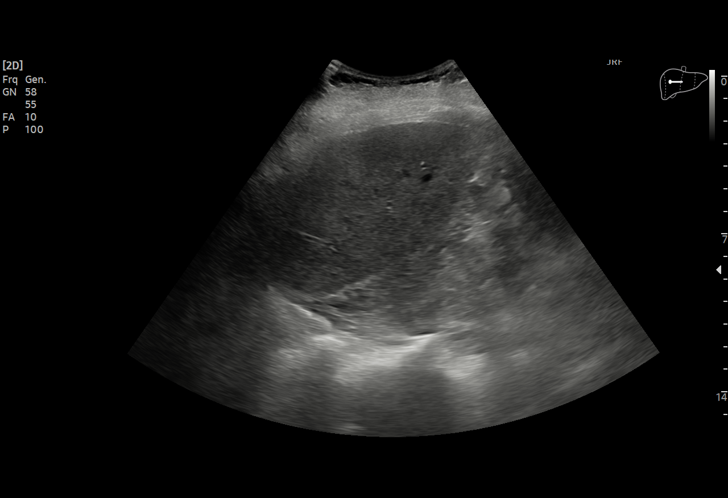
[im 35/35]
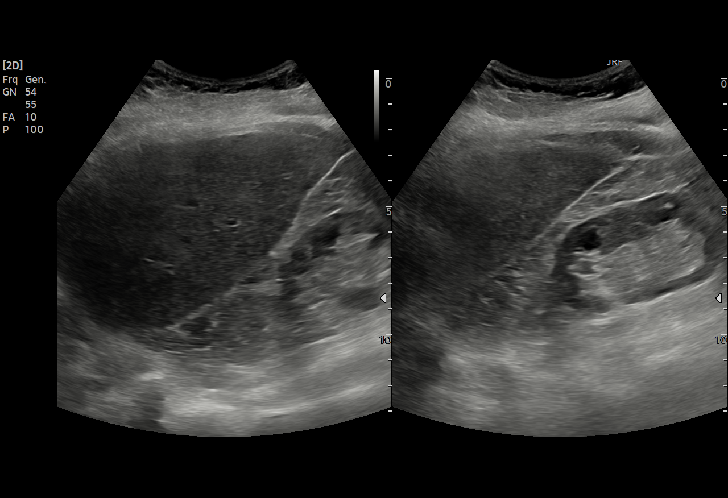

[15 of 25 positions shown; findings below may reference images not displayed]

FINDINGS: Gallbladder:

No gallstones or wall thickening visualized. No sonographic Murphy
sign noted by sonographer.

Common bile duct:

Diameter: 2.1 mm.

Liver:

No focal lesion identified. Within normal limits in parenchymal
echogenicity. Portal vein is patent on color Doppler imaging with
normal direction of blood flow towards the liver.

Other: None.
IMPRESSION: No acute abnormality noted.

## 2022-01-20 ENCOUNTER — Ambulatory Visit
Admission: RE | Admit: 2022-01-20 | Discharge: 2022-01-20 | Disposition: A | Discharge status: Home / Self Care | Payer: Medicare Other | Source: Ambulatory Visit | Attending: Gerontology | Admitting: Gerontology

## 2022-01-20 DIAGNOSIS — Z1231 Encounter for screening mammogram for malignant neoplasm of breast: Secondary | ICD-10-CM | POA: Insufficient documentation

## 2023-01-15 ENCOUNTER — Other Ambulatory Visit: Payer: Self-pay | Admitting: Gerontology

## 2023-01-15 DIAGNOSIS — R1031 Right lower quadrant pain: Secondary | ICD-10-CM

## 2023-01-18 ENCOUNTER — Encounter: Payer: Self-pay | Admitting: Gerontology

## 2023-01-19 ENCOUNTER — Ambulatory Visit
Admission: RE | Admit: 2023-01-19 | Discharge: 2023-01-19 | Disposition: A | Discharge status: Home / Self Care | Payer: Medicare Other | Source: Ambulatory Visit | Attending: Gerontology | Admitting: Gerontology

## 2023-01-19 DIAGNOSIS — R1031 Right lower quadrant pain: Secondary | ICD-10-CM

## 2023-03-04 ENCOUNTER — Encounter: Payer: Self-pay | Admitting: Gerontology

## 2023-03-04 ENCOUNTER — Other Ambulatory Visit: Payer: Self-pay | Admitting: Gerontology

## 2023-03-04 DIAGNOSIS — Z1231 Encounter for screening mammogram for malignant neoplasm of breast: Secondary | ICD-10-CM

## 2023-03-23 ENCOUNTER — Ambulatory Visit
Admission: RE | Admit: 2023-03-23 | Discharge: 2023-03-23 | Disposition: A | Discharge status: Home / Self Care | Payer: Medicare Other | Source: Ambulatory Visit | Attending: Gerontology | Admitting: Gerontology

## 2023-03-23 DIAGNOSIS — Z1231 Encounter for screening mammogram for malignant neoplasm of breast: Secondary | ICD-10-CM | POA: Diagnosis present

## 2024-05-18 ENCOUNTER — Other Ambulatory Visit: Payer: Self-pay | Admitting: Gerontology

## 2024-05-18 DIAGNOSIS — Z1231 Encounter for screening mammogram for malignant neoplasm of breast: Secondary | ICD-10-CM

## 2024-06-13 ENCOUNTER — Ambulatory Visit

## 2024-06-16 ENCOUNTER — Observation Stay
Admission: EM | Admit: 2024-06-16 | Source: Home / Self Care | Attending: Emergency Medicine | Admitting: Emergency Medicine

## 2024-06-16 ENCOUNTER — Other Ambulatory Visit: Payer: Self-pay

## 2024-06-16 ENCOUNTER — Emergency Department

## 2024-06-16 ENCOUNTER — Encounter: Payer: Self-pay | Admitting: Emergency Medicine

## 2024-06-16 DIAGNOSIS — M272 Inflammatory conditions of jaws: Principal | ICD-10-CM | POA: Diagnosis present

## 2024-06-16 DIAGNOSIS — I10 Essential (primary) hypertension: Secondary | ICD-10-CM | POA: Diagnosis present

## 2024-06-16 DIAGNOSIS — N1831 Chronic kidney disease, stage 3a: Secondary | ICD-10-CM | POA: Diagnosis present

## 2024-06-16 DIAGNOSIS — E785 Hyperlipidemia, unspecified: Secondary | ICD-10-CM | POA: Diagnosis present

## 2024-06-16 DIAGNOSIS — E875 Hyperkalemia: Secondary | ICD-10-CM | POA: Diagnosis present

## 2024-06-16 HISTORY — DX: Chronic kidney disease, stage 3a: N18.31

## 2024-06-16 LAB — CBC WITH DIFFERENTIAL/PLATELET
Abs Immature Granulocytes: 0.03 10*3/uL (ref 0.00–0.07)
Basophils Absolute: 0.1 10*3/uL (ref 0.0–0.1)
Basophils Relative: 1 %
Eosinophils Absolute: 0.1 10*3/uL (ref 0.0–0.5)
Eosinophils Relative: 2 %
HCT: 37 % (ref 36.0–46.0)
Hemoglobin: 12.4 g/dL (ref 12.0–15.0)
Immature Granulocytes: 1 %
Lymphocytes Relative: 24 %
Lymphs Abs: 1.6 10*3/uL (ref 0.7–4.0)
MCH: 33 pg (ref 26.0–34.0)
MCHC: 33.5 g/dL (ref 30.0–36.0)
MCV: 98.4 fL (ref 80.0–100.0)
Monocytes Absolute: 0.7 10*3/uL (ref 0.1–1.0)
Monocytes Relative: 10 %
Neutro Abs: 4 10*3/uL (ref 1.7–7.7)
Neutrophils Relative %: 62 %
Platelets: 310 10*3/uL (ref 150–400)
RBC: 3.76 MIL/uL — ABNORMAL LOW (ref 3.87–5.11)
RDW: 12 % (ref 11.5–15.5)
WBC: 6.5 10*3/uL (ref 4.0–10.5)
nRBC: 0 % (ref 0.0–0.2)

## 2024-06-16 LAB — COMPREHENSIVE METABOLIC PANEL WITH GFR
ALT: 14 U/L (ref 0–44)
AST: 25 U/L (ref 15–41)
Albumin: 4.1 g/dL (ref 3.5–5.0)
Alkaline Phosphatase: 47 U/L (ref 38–126)
Anion gap: 8 (ref 5–15)
BUN: 34 mg/dL — ABNORMAL HIGH (ref 8–23)
CO2: 28 mmol/L (ref 22–32)
Calcium: 10.8 mg/dL — ABNORMAL HIGH (ref 8.9–10.3)
Chloride: 100 mmol/L (ref 98–111)
Creatinine, Ser: 1.28 mg/dL — ABNORMAL HIGH (ref 0.44–1.00)
GFR, Estimated: 40 mL/min — ABNORMAL LOW
Glucose, Bld: 106 mg/dL — ABNORMAL HIGH (ref 70–99)
Potassium: 5.7 mmol/L — ABNORMAL HIGH (ref 3.5–5.1)
Sodium: 136 mmol/L (ref 135–145)
Total Bilirubin: 0.2 mg/dL (ref 0.0–1.2)
Total Protein: 6.8 g/dL (ref 6.5–8.1)

## 2024-06-16 LAB — LACTIC ACID, PLASMA: Lactic Acid, Venous: 1.5 mmol/L (ref 0.5–1.9)

## 2024-06-16 MED ORDER — VANCOMYCIN HCL IN DEXTROSE 1-5 GM/200ML-% IV SOLN
1000.0000 mg | Freq: Once | INTRAVENOUS | Status: AC
  Administered 2024-06-16: 1000 mg via INTRAVENOUS
  Filled 2024-06-16: qty 200

## 2024-06-16 MED ORDER — ONDANSETRON HCL 4 MG/2ML IJ SOLN: 4.0000 mg | Freq: Three times a day (TID) | INTRAMUSCULAR | Status: AC | PRN

## 2024-06-16 MED ORDER — ACETAMINOPHEN 325 MG PO TABS: 650.0000 mg | ORAL_TABLET | Freq: Four times a day (QID) | ORAL | Status: AC | PRN

## 2024-06-16 MED ORDER — PATIROMER SORBITEX CALCIUM 8.4 G PO PACK
16.8000 g | PACK | Freq: Once | ORAL | Status: AC
  Filled 2024-06-16: qty 2

## 2024-06-16 MED ORDER — CLINDAMYCIN PHOSPHATE 300 MG/50ML IV SOLN
300.0000 mg | Freq: Three times a day (TID) | INTRAVENOUS | Status: AC
  Filled 2024-06-16 (×2): qty 50

## 2024-06-16 MED ORDER — INSULIN ASPART 100 UNIT/ML IV SOLN: 5.0000 [IU] | Freq: Once | INTRAVENOUS | Status: AC

## 2024-06-16 MED ORDER — GADOBUTROL 1 MMOL/ML IV SOLN
5.0000 mL | Freq: Once | INTRAVENOUS | Status: AC | PRN
  Administered 2024-06-16: 5 mL via INTRAVENOUS

## 2024-06-16 MED ORDER — HYDRALAZINE HCL 20 MG/ML IJ SOLN: 5.0000 mg | INTRAMUSCULAR | Status: AC | PRN

## 2024-06-16 MED ORDER — OXYCODONE-ACETAMINOPHEN 5-325 MG PO TABS: 1.0000 | ORAL_TABLET | ORAL | Status: AC | PRN

## 2024-06-16 MED ORDER — MORPHINE SULFATE (PF) 2 MG/ML IV SOLN: 0.5000 mg | INTRAVENOUS | Status: AC | PRN

## 2024-06-16 MED ORDER — DEXTROSE 50 % IV SOLN: 50.0000 mL | Freq: Once | INTRAVENOUS | Status: AC

## 2024-06-16 MED ORDER — HEPARIN SODIUM (PORCINE) 5000 UNIT/ML IJ SOLN: 5000.0000 [IU] | Freq: Three times a day (TID) | INTRAMUSCULAR | Status: AC

## 2024-06-16 MED ORDER — SODIUM CHLORIDE 0.9 % IV SOLN: INTRAVENOUS | Status: AC

## 2024-06-16 MED ORDER — SACCHAROMYCES BOULARDII 250 MG PO CAPS
250.0000 mg | ORAL_CAPSULE | Freq: Two times a day (BID) | ORAL | Status: AC
  Filled 2024-06-16: qty 1

## 2024-06-16 MED ORDER — SODIUM CHLORIDE 0.9 % IV BOLUS
500.0000 mL | Freq: Once | INTRAVENOUS | Status: AC
  Administered 2024-06-16: 500 mL via INTRAVENOUS

## 2024-06-16 NOTE — ED Notes (Signed)
 Pt to MRI

## 2024-06-16 NOTE — ED Triage Notes (Signed)
 PT BIB daughters. Pt had several teeth removed last year on left side of mouth and has continue to have pain and numbness. Pt has seen oral surgeon a few weeks ago and was told she had osteomyelitis of the jaw. Denies any fever. Has been on oral antibiotics with no improvement and was told to come to the ER to get IV antibiotics. Decrease appetite due to pain when trying to eat or drink.

## 2024-06-16 NOTE — ED Provider Notes (Signed)
 "  [facility] Provider Note    Event Date/Time   First MD Initiated Contact with Patient 06/16/24 1653     (approximate)   History   Oral Swelling   HPI  April Weaver is a 89 y.o. female with a history of hypertension, hypercholesterolemia, GERD, osteoporosis, prediabetes, and anxiety who presents with jaw pain and concern for osteomyelitis.  The family reports that over the last several weeks she has had worsening left-sided jaw pain now progressing to the right and associated with lip numbness.  She had several teeth removed on the left side last year.  The patient was following with an oral surgeon.  Based on x-rays she was told that she had osteomyelitis of the jaw.  She was started on oral cefuroxime and Flagyl couple weeks ago but had difficulty tolerating them and discontinued after a week.  She followed back up with her oral surgeon and was told to come to the ED for IV antibiotics.  She was also told she may need an I&D.  The patient reports difficulty eating due to the pain although she is able to swallow without any problems.  Her appetite has been lower.  She feels dehydrated and somewhat weak and tired.  She has no fever or chills.  She has no facial swelling.  I reviewed the past medical records.  The patient's most recent outpatient visit in our system was on 8/25 of last year with family medicine for follow-up of her chronic conditions.   Physical Exam   Triage Vital Signs: ED Triage Vitals  Encounter Vitals Group     BP 06/16/24 1509 (!) 142/85     Girls Systolic BP Percentile --      Girls Diastolic BP Percentile --      Boys Systolic BP Percentile --      Boys Diastolic BP Percentile --      Pulse Rate 06/16/24 1509 93     Resp 06/16/24 1509 18     Temp 06/16/24 1509 97.8 F (36.6 C)     Temp src --      SpO2 06/16/24 1509 96 %     Weight 06/16/24 1635 113 lb 5.1 oz (51.4 kg)     Height 06/16/24 1635 5' 3 (1.6 m)     Head  Circumference --      Peak Flow --      Pain Score 06/16/24 1510 7     Pain Loc --      Pain Education --      Exclude from Growth Chart --     Most recent vital signs: Vitals:   06/16/24 1509 06/16/24 2341  BP: (!) 142/85 136/71  Pulse: 93 71  Resp: 18 16  Temp: 97.8 F (36.6 C) 97.7 F (36.5 C)  SpO2: 96% 98%    General: Awake, no distress.  CV:  Good peripheral perfusion.  Resp:  Normal effort.  Abd:  No distention.  Other:  No lip or facial swelling.  No significant mandible tenderness.  Several removed teeth to the left lower mandible with no palpable mass or fluctuance.   ED Results / Procedures / Treatments   Labs (all labs ordered are listed, but only abnormal results are displayed) Labs Reviewed  COMPREHENSIVE METABOLIC PANEL WITH GFR - Abnormal; Notable for the following components:      Result Value   Potassium 5.7 (*)    Glucose, Bld 106 (*)    BUN 34 (*)  Creatinine, Ser 1.28 (*)    Calcium  10.8 (*)    GFR, Estimated 40 (*)    All other components within normal limits  CBC WITH DIFFERENTIAL/PLATELET - Abnormal; Notable for the following components:   RBC 3.76 (*)    All other components within normal limits  CULTURE, BLOOD (ROUTINE X 2)  CULTURE, BLOOD (ROUTINE X 2)  LACTIC ACID, PLASMA  C-REACTIVE PROTEIN  BASIC METABOLIC PANEL WITH GFR  CBC  PROTIME-INR  APTT  SEDIMENTATION RATE  MAGNESIUM  PHOSPHORUS     EKG     RADIOLOGY  MRI face:   IMPRESSION:  1. Focal T2 hyperintensity and enhancement in the posterior left mandible,  compatible with extracted tooth socket and residual edema. Focal osteomyelitis  at this site not excluded.  2. No other sites of edema or enhancement in the mandible.    PROCEDURES:  Critical Care performed: No  Procedures   MEDICATIONS ORDERED IN ED: Medications  vancomycin  (VANCOCIN ) IVPB 1000 mg/200 mL premix (1,000 mg Intravenous New Bag/Given 06/16/24 2339)  morphine  (PF) 2 MG/ML injection 0.5  mg (has no administration in time range)  oxyCODONE -acetaminophen  (PERCOCET/ROXICET) 5-325 MG per tablet 1 tablet (has no administration in time range)  ondansetron  (ZOFRAN ) injection 4 mg (has no administration in time range)  hydrALAZINE  (APRESOLINE ) injection 5 mg (has no administration in time range)  acetaminophen  (TYLENOL ) tablet 650 mg (has no administration in time range)  patiromer  (VELTASSA ) packet 16.8 g (has no administration in time range)  dextrose  50 % solution 50 mL (has no administration in time range)  insulin  aspart (novoLOG ) injection 5 Units (has no administration in time range)  0.9 %  sodium chloride  infusion (has no administration in time range)  clindamycin  (CLEOCIN ) IVPB 300 mg (has no administration in time range)  saccharomyces boulardii (FLORASTOR) capsule 250 mg (has no administration in time range)  heparin  injection 5,000 Units (has no administration in time range)  sodium chloride  0.9 % bolus 500 mL (0 mLs Intravenous Stopped 06/16/24 2255)  gadobutrol  (GADAVIST ) 1 MMOL/ML injection 5 mL (5 mLs Intravenous Contrast Given 06/16/24 2149)     IMPRESSION / MDM / ASSESSMENT AND PLAN / ED COURSE  I reviewed the triage vital signs and the nursing notes.  89 year old female with PMH as noted above presents with concern for possible mandible osteomyelitis based on outpatient oral surgery evaluation.  I have no direct access to the oral surgery imaging or records.  On exam the patient is overall quite well-appearing.  Her vital signs are normal.  Physical exam does not reveal any concerning acute findings.  Differential diagnosis includes, but is not limited to, osteomyelitis, other dental infection, TMJ, other subacute or chronic dental pain.  CMP shows no acute abnormalities.  CBC shows no leukocytosis.  Lactate is normal.  I have ordered an MRI of the face to further evaluate.  Patient's presentation is most consistent with acute complicated illness / injury requiring  diagnostic workup.  ----------------------------------------- 11:27 PM on 06/16/2024 -----------------------------------------  MRI shows possible mandible cellulitis.  I ordered empiric IV antibiotics.  I had an extensive discussion with the patient and her family members.  Although there is no evidence of abscess or indication for emergent surgery, I am somewhat concerned in case the symptoms worsen about admitting the patient here or we do not have any dentist or oral surgeon on-call.  There is also not a dentist or oral surgeon on-call at [facility].  Therefore I recommended that we consider  transfer to a tertiary center for continued treatmen-t that way if the patient symptoms worsen or she does need a procedure this will be available to her.  However, the patient and her family members declined transfer at this time.  They agree with IV antibiotics and want her to get treated, but since she is stable they do not want her transported to a different hospital.  They would like to see how she responds to IV antibiotics and then potentially even have her follow-up with her outpatient oral surgeon if needed.  I consulted Dr. Hilma from the hospitalist service; based on our discussion he agrees to evaluate the patient for admission.   FINAL CLINICAL IMPRESSION(S) / ED DIAGNOSES   Final diagnoses:  Osteomyelitis of mandible     Rx / DC Orders   ED Discharge Orders     None        Note:  This document was prepared using Dragon voice recognition software and may include unintentional dictation errors.    Jacolyn Pae, MD 06/16/24 2349  "

## 2024-06-16 NOTE — H&P (Incomplete)
 " History and Physical    April Weaver FMW:[id number] DOB: November 06, 1931 DOA: 06/16/2024  Referring MD/NP/PA:   PCP: Steva Clotilda DEL, NP   Patient coming from:  The patient is coming from home.     Chief Complaint: left lower jaw pain  HPI: April Weaver is a 89 y.o. female with medical history significant of HTN, HLD, GERD, CKD-3a, osteoporosis, overactive bladder, who presents with left lower jaw pain.   Pt states that she had several left lower molar teeth removed last April and continues to have pain in left lower jaw. Also has intermittent left lower lip numbness.  The pain is constant, mild to moderate, aching, nonradiating, aggravated by chewing food.  No fever or chills. The patient was following with an oral surgeon.  Based on x-rays she was told that she had osteomyelitis of the jaw.  She was started on oral cefuroxime and Flagyl couple weeks ago but had difficulty tolerating them and stopped taking them after a week.  She followed back up with her oral surgeon who recommended to come to ED for IV antibiotics.  Patient does not have chest pain, cough, SOB.  No nausea, vomiting, diarrhea or abdominal pain.  No symptoms UTI.  Patient has poor appetite and decreased oral intake due to pain.  She feels dehydrated.    We do not have oral surgeon coverage. Dr. Dr. Jacolyn of ED    Therefore I recommended that we consider transfer to a tertiary center for continued treatmen-t that way if the patient symptoms worsen or she does need a procedure this will be available to her.  However, the patient and her family members declined transfer at this time.  They agree with IV antibiotics and want her to get treated, but since she is stable they do not want her transported to a different hospital.  They would like to see how she responds to IV antibiotics and then potentially even have her follow-up with her outpatient oral surgeon if needed.    but the patient refused being  transferred.     Data reviewed independently and ED Course: pt was found to have     ***       EKG: I have personally reviewed.  Not done in ED, will get one.   ***   Review of Systems:   General: no fevers, chills, no body weight gain, has poor appetite, has fatigue HEENT: no blurry vision, hearing changes or sore throat Respiratory: no dyspnea, coughing, wheezing CV: no chest pain, no palpitations GI: no nausea, vomiting, abdominal pain, diarrhea, constipation GU: no dysuria, burning on urination, increased urinary frequency, hematuria  Ext: no leg edema Neuro: no unilateral weakness, numbness, or tingling, no vision change or hearing loss Skin: no rash, no skin tear. MSK: No muscle spasm, no deformity, no limitation of range of movement in spin Heme: No easy bruising.  Travel history: No recent long distant travel.   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 She has never  used smokeless tobacco. She reports that she does not drink alcohol and does not use drugs.  Family History:  Family History  Problem Relation Age of Onset   Breast cancer Cousin 39       mat cousin   Hematuria Neg Hx    Prostate cancer Neg Hx    Kidney cancer Neg Hx    Bladder Cancer Neg Hx      Prior to Admission medications  Medication Sig Start Date End Date Taking? Authorizing Provider  alendronate (FOSAMAX) 70 MG tablet Take 70 mg by mouth once a week. Pt takes med on Sunday 11/26/14   [provider]  amLODipine  (NORVASC ) 5 MG  tablet Take 5 mg by mouth daily. 01/21/15   [provider]  aspirin  EC 81 MG tablet Take 81 mg by mouth once.    [provider]  calcium  carbonate (OS-CAL - DOSED IN MG OF ELEMENTAL CALCIUM ) 1250 (500 CA) MG tablet Take 1 tablet by mouth daily with breakfast.    [provider]  Cholecalciferol 50 MCG (2000 UT) CAPS Take 1 capsule by mouth daily. 01/12/19   [provider]  docusate calcium  (SURFAK) 240 MG capsule Take 240 mg by mouth daily.    [provider]  Glucosamine-Chondroitin 750-600 MG CHEW Chew by mouth.    [provider]  ipratropium (ATROVENT) 0.03 % nasal spray Place 2 sprays into both nostrils 2 (two) times daily. 10/01/20   [provider]  lisinopril-hydrochlorothiazide (PRINZIDE,ZESTORETIC) 10-12.5 MG tablet Take 1 tablet by mouth daily. 01/21/15   [provider]  lovastatin (MEVACOR) 20 MG tablet Take 20 mg by mouth at bedtime. 01/21/15   [provider]  mirabegron  ER (MYRBETRIQ ) 50 MG TB24 tablet Take 50 mg by mouth daily.    [provider]  Multiple Vitamins-Minerals (CENTRUM SILVER ADULT 50+ PO) Take 1 tablet by mouth daily.    [provider]  omeprazole (PRILOSEC) 10 MG capsule Take 10 mg by mouth daily. 01/21/15   [provider]  Simethicone  125 MG CAPS Take 1 capsule by mouth as needed.    [provider]  vitamin E 400 UNIT capsule Take 400 Units by mouth daily.    [provider]    Physical Exam: Vitals:   06/16/24 1509 06/16/24 1635  BP: (!) 142/85   Pulse: 93   Resp: 18   Temp: 97.8 F (36.6 C)   SpO2: 96%   Weight:  51.4 kg  Height:  5' 3 (1.6 m)   General: Not in acute distress HEENT:       Eyes: PERRL, EOMI, no jaundice       ENT: No discharge from the ears and nose, no pharynx injection, no tonsillar enlargement.        Neck: No JVD, no bruit, no mass felt. Heme: No neck lymph node enlargement. Cardiac: S1/S2, RRR, No  murmurs, No gallops or rubs. Respiratory: No rales, wheezing, rhonchi or rubs. GI: Soft, nondistended, nontender, no rebound pain, no organomegaly, BS present. GU: No hematuria Ext: No pitting leg edema bilaterally. 1+DP/PT pulse bilaterally. Musculoskeletal: No joint deformities, No joint redness or warmth, no limitation of ROM in spin. Skin: No rashes.  Neuro: Alert, oriented X3, cranial nerves II-XII grossly intact, moves all extremities normally. Muscle strength 5/5 in all extremities, sensation to light touch intact. Brachial reflex 2+ bilaterally. Knee reflex 1+ bilaterally. Negative Babinski's sign. Normal finger to nose test. Psych: Patient is not psychotic, no suicidal or  hemocidal ideation.  Labs on Admission: I have personally reviewed following labs and imaging studies  CBC: Recent Labs  Lab 06/16/24 1514  WBC 6.5  NEUTROABS 4.0  HGB 12.4  HCT 37.0  MCV 98.4  PLT 310   Basic Metabolic Panel: Recent Labs  Lab 06/16/24 1514  NA 136  K 5.7*  CL 100  CO2 28  GLUCOSE 106*  BUN 34*  CREATININE 1.28*  CALCIUM  10.8*   GFR: Estimated Creatinine Clearance: 23.7 mL/min (A) (by C-G formula based on SCr of 1.28 mg/dL (H)). Liver Function Tests: Recent Labs  Lab 06/16/24 1514  AST 25  ALT 14  ALKPHOS 47  BILITOT 0.2  PROT 6.8  ALBUMIN  4.1   No results for input(s): LIPASE, AMYLASE in the last 168 hours. No results for input(s): AMMONIA in the last 168 hours. Coagulation Profile: No results for input(s): INR, PROTIME in the last 168 hours. Cardiac Enzymes: No results for input(s): CKTOTAL, CKMB, CKMBINDEX, TROPONINI in the last 168 hours. BNP (last 3 results) No results for input(s): PROBNP in the last 8760 hours. HbA1C: No results for input(s): HGBA1C in the last 72 hours. CBG: No results for input(s): GLUCAP in the last 168 hours. Lipid Profile: No results for input(s): CHOL, HDL, LDLCALC, TRIG, CHOLHDL, LDLDIRECT in the  last 72 hours. Thyroid Function Tests: No results for input(s): TSH, T4TOTAL, FREET4, T3FREE, THYROIDAB in the last 72 hours. Anemia Panel: No results for input(s): VITAMINB12, FOLATE, FERRITIN, TIBC, IRON, RETICCTPCT in the last 72 hours. Urine analysis:    Component Value Date/Time   COLORURINE YELLOW (A) 10/04/2020 1447   APPEARANCEUR CLEAR (A) 10/04/2020 1447   APPEARANCEUR Clear 02/11/2015 1108   LABSPEC 1.016 10/04/2020 1447   PHURINE 6.0 10/04/2020 1447   GLUCOSEU NEGATIVE 10/04/2020 1447   HGBUR SMALL (A) 10/04/2020 1447   BILIRUBINUR NEGATIVE 10/04/2020 1447   BILIRUBINUR Negative 02/11/2015 1108   KETONESUR NEGATIVE 10/04/2020 1447   PROTEINUR 30 (A) 10/04/2020 1447   NITRITE NEGATIVE 10/04/2020 1447   LEUKOCYTESUR NEGATIVE 10/04/2020 1447   Sepsis Labs: @LABRCNTIP (procalcitonin:4,lacticidven:4) )No results found for this or any previous visit (from the past 240 hours).   Radiological Exams on Admission:   Assessment/Plan Active Problems:   * No active hospital problems. *   Assessment and Plan: No notes have been filed under this hospital service. Service: Hospitalist      Active Problems:   * No active hospital problems. *    DVT ppx: SQ Heparin          SQ Lovenox   Code Status: Full code   ***  Family Communication:     not done, no family member is at bed side.              Yes, patient's    at bed side.       by phone   ***  Disposition Plan:  Anticipate discharge back to previous environment  Consults called:    Admission status and Level of care: :    for obs as inpt        Dispo: The patient is from: {From:23814}              Anticipated d/c is to: {To:23815}              Anticipated d/c date is: {Days:23816}              Patient currently {Medically stable:23817}    Severity of Illness:  {Observation/Inpatient:21159}  Date of Service 06/16/2024    Caleb Exon [facility]   If 7PM-7AM,  please contact night-coverage [email or web address] 06/16/2024, 11:19 PM       [1] Allergies Allergen Reactions   Celecoxib Other (See Comments)    Shakiness   Metronidazole Nausea Only   Typhoid Vaccines Swelling   Penicillins Rash  "

## 2024-06-16 NOTE — H&P (Incomplete)
 " History and Physical    April Weaver FMW:[id number] DOB: Mar 16, 1932 DOA: 06/16/2024  Referring MD/NP/PA:   PCP: Steva Clotilda DEL, NP   Patient coming from:  The patient is coming from home.     Chief Complaint:   HPI: April Weaver is a 89 y.o. female with medical history significant of      Data reviewed independently and ED Course: pt was found to have     ***       EKG: I have personally reviewed.  Not done in ED, will get one.   ***   Review of Systems:   General: no fevers, chills, no body weight gain, has poor appetite, has fatigue HEENT: no blurry vision, hearing changes or sore throat Respiratory: no dyspnea, coughing, wheezing CV: no chest pain, no palpitations GI: no nausea, vomiting, abdominal pain, diarrhea, constipation GU: no dysuria, burning on urination, increased urinary frequency, hematuria  Ext: no leg edema Neuro: no unilateral weakness, numbness, or tingling, no vision change or hearing loss Skin: no rash, no skin tear. MSK: No muscle spasm, no deformity, no limitation of range of movement in spin Heme: No easy bruising.  Travel history: No recent long distant travel.   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 She has never used smokeless tobacco. She reports that she does not drink alcohol and does not use drugs.  Family History:  Family History  Problem Relation Age of Onset   Breast cancer Cousin 62       mat cousin   Hematuria Neg Hx    Prostate cancer Neg Hx    Kidney cancer Neg Hx     Bladder Cancer Neg Hx      Prior to Admission medications  Medication Sig Start Date End Date Taking? Authorizing Provider  alendronate (FOSAMAX) 70 MG tablet Take 70 mg by mouth once a week. Pt takes med on Sunday 11/26/14   [provider]  amLODipine  (NORVASC ) 5 MG tablet Take 5 mg by mouth daily. 01/21/15   [provider]  aspirin  EC 81 MG tablet Take 81 mg by mouth once.    [provider]  calcium  carbonate (OS-CAL - DOSED IN MG OF ELEMENTAL CALCIUM ) 1250 (500 CA) MG tablet Take 1 tablet by mouth daily with breakfast.    [provider]  Cholecalciferol 50 MCG (2000 UT) CAPS Take 1 capsule by mouth daily. 01/12/19   [provider]  docusate calcium  (SURFAK) 240 MG capsule Take 240 mg by mouth daily.    [provider]  Glucosamine-Chondroitin 750-600 MG CHEW Chew by mouth.    [provider]  ipratropium (ATROVENT) 0.03 % nasal spray Place 2 sprays into both nostrils 2 (two) times daily. 10/01/20   [provider]  lisinopril-hydrochlorothiazide (PRINZIDE,ZESTORETIC) 10-12.5 MG tablet Take 1 tablet by mouth daily. 01/21/15   [provider]  lovastatin (MEVACOR) 20 MG tablet Take 20 mg by mouth at bedtime. 01/21/15   [provider]  mirabegron  ER (  MYRBETRIQ ) 50 MG TB24 tablet Take 50 mg by mouth daily.    [provider]  Multiple Vitamins-Minerals (CENTRUM SILVER ADULT 50+ PO) Take 1 tablet by mouth daily.    [provider]  omeprazole (PRILOSEC) 10 MG capsule Take 10 mg by mouth daily. 01/21/15   [provider]  Simethicone  125 MG CAPS Take 1 capsule by mouth as needed.    [provider]  vitamin E 400 UNIT capsule Take 400 Units by mouth daily.    [provider]    Physical Exam: Vitals:   06/16/24 1509 06/16/24 1635  BP: (!) 142/85   Pulse: 93   Resp: 18   Temp: 97.8 F (36.6 C)   SpO2: 96%   Weight:  51.4 kg  Height:  5' 3 (1.6 m)    General: Not in acute distress HEENT:       Eyes: PERRL, EOMI, no jaundice       ENT: No discharge from the ears and nose, no pharynx injection, no tonsillar enlargement.        Neck: No JVD, no bruit, no mass felt. Heme: No neck lymph node enlargement. Cardiac: S1/S2, RRR, No murmurs, No gallops or rubs. Respiratory: No rales, wheezing, rhonchi or rubs. GI: Soft, nondistended, nontender, no rebound pain, no organomegaly, BS present. GU: No hematuria Ext: No pitting leg edema bilaterally. 1+DP/PT pulse bilaterally. Musculoskeletal: No joint deformities, No joint redness or warmth, no limitation of ROM in spin. Skin: No rashes.  Neuro: Alert, oriented X3, cranial nerves II-XII grossly intact, moves all extremities normally. Muscle strength 5/5 in all extremities, sensation to light touch intact. Brachial reflex 2+ bilaterally. Knee reflex 1+ bilaterally. Negative Babinski's sign. Normal finger to nose test. Psych: Patient is not psychotic, no suicidal or hemocidal ideation.  Labs on Admission: I have personally reviewed following labs and imaging studies  CBC: Recent Labs  Lab 06/16/24 1514  WBC 6.5  NEUTROABS 4.0  HGB 12.4  HCT 37.0  MCV 98.4  PLT 310   Basic Metabolic Panel: Recent Labs  Lab 06/16/24 1514  NA 136  K 5.7*  CL 100  CO2 28  GLUCOSE 106*  BUN 34*  CREATININE 1.28*  CALCIUM  10.8*   GFR: Estimated Creatinine Clearance: 23.7 mL/min (A) (by C-G formula based on SCr of 1.28 mg/dL (H)). Liver Function Tests: Recent Labs  Lab 06/16/24 1514  AST 25  ALT 14  ALKPHOS 47  BILITOT 0.2  PROT 6.8  ALBUMIN  4.1   No results for input(s): LIPASE, AMYLASE in the last 168 hours. No results for input(s): AMMONIA in the last 168 hours. Coagulation Profile: No results for input(s): INR, PROTIME in the last 168 hours. Cardiac Enzymes: No results for input(s): CKTOTAL, CKMB, CKMBINDEX, TROPONINI in the last 168 hours. BNP (last 3 results) No  results for input(s): PROBNP in the last 8760 hours. HbA1C: No results for input(s): HGBA1C in the last 72 hours. CBG: No results for input(s): GLUCAP in the last 168 hours. Lipid Profile: No results for input(s): CHOL, HDL, LDLCALC, TRIG, CHOLHDL, LDLDIRECT in the last 72 hours. Thyroid Function Tests: No results for input(s): TSH, T4TOTAL, FREET4, T3FREE, THYROIDAB in the last 72 hours. Anemia Panel: No results for input(s): VITAMINB12, FOLATE, FERRITIN, TIBC, IRON, RETICCTPCT in the last 72 hours. Urine analysis:    Component Value Date/Time   COLORURINE YELLOW (A) 10/04/2020 1447   APPEARANCEUR CLEAR (A) 10/04/2020 1447   APPEARANCEUR Clear 02/11/2015 1108   LABSPEC 1.016 10/04/2020  1447   PHURINE 6.0 10/04/2020 1447   GLUCOSEU NEGATIVE 10/04/2020 1447   HGBUR SMALL (A) 10/04/2020 1447   BILIRUBINUR NEGATIVE 10/04/2020 1447   BILIRUBINUR Negative 02/11/2015 1108   KETONESUR NEGATIVE 10/04/2020 1447   PROTEINUR 30 (A) 10/04/2020 1447   NITRITE NEGATIVE 10/04/2020 1447   LEUKOCYTESUR NEGATIVE 10/04/2020 1447   Sepsis Labs: @LABRCNTIP (procalcitonin:4,lacticidven:4) )No results found for this or any previous visit (from the past 240 hours).   Radiological Exams on Admission:   Assessment/Plan Active Problems:   * No active hospital problems. *   Assessment and Plan: No notes have been filed under this hospital service. Service: Hospitalist      Active Problems:   * No active hospital problems. *    DVT ppx: SQ Heparin          SQ Lovenox   Code Status: Full code   ***  Family Communication:     not done, no family member is at bed side.              Yes, patient's    at bed side.       by phone   ***  Disposition Plan:  Anticipate discharge back to previous environment  Consults called:    Admission status and Level of care: :    for obs as inpt        Dispo: The patient is from: {From:23814}               Anticipated d/c is to: {To:23815}              Anticipated d/c date is: {Days:23816}              Patient currently {Medically stable:23817}    Severity of Illness:  {Observation/Inpatient:21159}       Date of Service 06/16/2024    Caleb Exon [facility]   If 7PM-7AM, please contact night-coverage [email or web address] 06/16/2024, 11:19 PM     [1]  Allergies Allergen Reactions   Celecoxib Other (See Comments)    Shakiness   Metronidazole Nausea Only   Typhoid Vaccines Swelling   Penicillins Rash   "

## 2024-06-16 NOTE — ED Notes (Signed)
 See triage note  Presents with some oral swelling  States she has had some teeth pulled several weeks ago  Conts to have pain with swelling  Denies any fever

## 2024-07-10 ENCOUNTER — Ambulatory Visit
